# Patient Record
Sex: Male | Born: 1960 | Race: White | Hispanic: No | Smoking: Heavy tobacco smoker
Health system: Southern US, Community
[De-identification: ages and names within clinical notes are randomized; demographics above are authoritative.]

## PROBLEM LIST (undated history)

## (undated) DIAGNOSIS — I2510 Atherosclerotic heart disease of native coronary artery without angina pectoris: Secondary | ICD-10-CM

## (undated) DIAGNOSIS — M549 Dorsalgia, unspecified: Secondary | ICD-10-CM

## (undated) DIAGNOSIS — G629 Polyneuropathy, unspecified: Secondary | ICD-10-CM

## (undated) DIAGNOSIS — F329 Major depressive disorder, single episode, unspecified: Secondary | ICD-10-CM

## (undated) DIAGNOSIS — G8929 Other chronic pain: Secondary | ICD-10-CM

## (undated) DIAGNOSIS — M1990 Unspecified osteoarthritis, unspecified site: Secondary | ICD-10-CM

## (undated) DIAGNOSIS — I639 Cerebral infarction, unspecified: Secondary | ICD-10-CM

## (undated) DIAGNOSIS — M869 Osteomyelitis, unspecified: Secondary | ICD-10-CM

## (undated) DIAGNOSIS — L0390 Cellulitis, unspecified: Secondary | ICD-10-CM

## (undated) DIAGNOSIS — H547 Unspecified visual loss: Secondary | ICD-10-CM

## (undated) DIAGNOSIS — K219 Gastro-esophageal reflux disease without esophagitis: Secondary | ICD-10-CM

## (undated) DIAGNOSIS — F32A Depression, unspecified: Secondary | ICD-10-CM

## (undated) DIAGNOSIS — I209 Angina pectoris, unspecified: Secondary | ICD-10-CM

## (undated) DIAGNOSIS — E785 Hyperlipidemia, unspecified: Secondary | ICD-10-CM

## (undated) DIAGNOSIS — F411 Generalized anxiety disorder: Secondary | ICD-10-CM

## (undated) DIAGNOSIS — I219 Acute myocardial infarction, unspecified: Secondary | ICD-10-CM

## (undated) DIAGNOSIS — E291 Testicular hypofunction: Secondary | ICD-10-CM

## (undated) DIAGNOSIS — N179 Acute kidney failure, unspecified: Secondary | ICD-10-CM

## (undated) DIAGNOSIS — I10 Essential (primary) hypertension: Secondary | ICD-10-CM

## (undated) DIAGNOSIS — F419 Anxiety disorder, unspecified: Secondary | ICD-10-CM

## (undated) DIAGNOSIS — G4733 Obstructive sleep apnea (adult) (pediatric): Secondary | ICD-10-CM

## (undated) HISTORY — PX: NECK SURGERY: SHX720

## (undated) HISTORY — PX: BONY PELVIS SURGERY: SHX572

## (undated) HISTORY — DX: Other chronic pain: G89.29

## (undated) HISTORY — DX: Obstructive sleep apnea (adult) (pediatric): G47.33

## (undated) HISTORY — DX: Generalized anxiety disorder: F41.1

## (undated) HISTORY — PX: FRACTURE SURGERY: SHX138

## (undated) HISTORY — DX: Dorsalgia, unspecified: M54.9

## (undated) HISTORY — PX: CORONARY ANGIOPLASTY: SHX604

## (undated) HISTORY — PX: BLADDER REPAIR: SHX76

## (undated) HISTORY — PX: CARDIAC CATHETERIZATION: SHX172

## (undated) HISTORY — DX: Hyperlipidemia, unspecified: E78.5

## (undated) HISTORY — DX: Polyneuropathy, unspecified: G62.9

## (undated) HISTORY — DX: Testicular hypofunction: E29.1

## (undated) HISTORY — DX: Depression, unspecified: F32.A

## (undated) HISTORY — DX: Atherosclerotic heart disease of native coronary artery without angina pectoris: I25.10

## (undated) HISTORY — DX: Essential (primary) hypertension: I10

## (undated) HISTORY — DX: Major depressive disorder, single episode, unspecified: F32.9

## (undated) HISTORY — PX: CORONARY ANGIOPLASTY WITH STENT PLACEMENT: SHX49

---

## 1999-07-26 ENCOUNTER — Encounter: Payer: Self-pay | Admitting: Orthopedic Surgery

## 1999-07-26 ENCOUNTER — Ambulatory Visit (HOSPITAL_COMMUNITY): Admission: RE | Admit: 1999-07-26 | Discharge: 1999-07-26 | Payer: Self-pay | Admitting: Orthopedic Surgery

## 1999-08-04 ENCOUNTER — Encounter (INDEPENDENT_AMBULATORY_CARE_PROVIDER_SITE_OTHER): Payer: Self-pay

## 1999-08-04 ENCOUNTER — Encounter: Payer: Self-pay | Admitting: Orthopedic Surgery

## 1999-08-05 ENCOUNTER — Encounter: Payer: Self-pay | Admitting: Surgery

## 1999-08-05 ENCOUNTER — Inpatient Hospital Stay (HOSPITAL_COMMUNITY): Admission: RE | Admit: 1999-08-05 | Discharge: 1999-08-06 | Payer: Self-pay | Admitting: Orthopedic Surgery

## 1999-08-17 ENCOUNTER — Ambulatory Visit (HOSPITAL_COMMUNITY): Admission: RE | Admit: 1999-08-17 | Discharge: 1999-08-17 | Payer: Self-pay | Admitting: Urology

## 1999-08-17 ENCOUNTER — Encounter: Payer: Self-pay | Admitting: Urology

## 1999-08-23 ENCOUNTER — Ambulatory Visit (HOSPITAL_COMMUNITY): Admission: RE | Admit: 1999-08-23 | Discharge: 1999-08-23 | Payer: Self-pay | Admitting: Urology

## 1999-08-23 ENCOUNTER — Encounter: Payer: Self-pay | Admitting: Urology

## 1999-08-24 ENCOUNTER — Encounter: Payer: Self-pay | Admitting: Urology

## 1999-08-24 ENCOUNTER — Observation Stay (HOSPITAL_COMMUNITY): Admission: RE | Admit: 1999-08-24 | Discharge: 1999-08-25 | Payer: Self-pay | Admitting: Urology

## 2000-10-19 ENCOUNTER — Encounter: Payer: Self-pay | Admitting: Family Medicine

## 2000-10-19 ENCOUNTER — Encounter: Admission: RE | Admit: 2000-10-19 | Discharge: 2000-10-19 | Payer: Self-pay | Admitting: Family Medicine

## 2002-01-17 ENCOUNTER — Encounter: Payer: Self-pay | Admitting: Family Medicine

## 2002-01-17 ENCOUNTER — Encounter: Admission: RE | Admit: 2002-01-17 | Discharge: 2002-01-17 | Payer: Self-pay | Admitting: Family Medicine

## 2003-05-03 ENCOUNTER — Emergency Department (HOSPITAL_COMMUNITY): Admission: AD | Admit: 2003-05-03 | Discharge: 2003-05-03 | Payer: Self-pay | Admitting: Emergency Medicine

## 2003-05-03 ENCOUNTER — Encounter: Payer: Self-pay | Admitting: Emergency Medicine

## 2003-05-09 ENCOUNTER — Encounter: Payer: Self-pay | Admitting: Orthopedic Surgery

## 2003-05-09 ENCOUNTER — Ambulatory Visit (HOSPITAL_COMMUNITY): Admission: RE | Admit: 2003-05-09 | Discharge: 2003-05-09 | Payer: Self-pay | Admitting: Orthopedic Surgery

## 2003-05-17 ENCOUNTER — Ambulatory Visit (HOSPITAL_COMMUNITY): Admission: RE | Admit: 2003-05-17 | Discharge: 2003-05-17 | Payer: Self-pay | Admitting: Orthopedic Surgery

## 2003-05-29 ENCOUNTER — Ambulatory Visit (HOSPITAL_COMMUNITY): Admission: RE | Admit: 2003-05-29 | Discharge: 2003-05-29 | Payer: Self-pay | Admitting: Orthopedic Surgery

## 2003-05-29 ENCOUNTER — Encounter: Payer: Self-pay | Admitting: Orthopedic Surgery

## 2003-06-26 ENCOUNTER — Emergency Department (HOSPITAL_COMMUNITY): Admission: EM | Admit: 2003-06-26 | Discharge: 2003-06-27 | Payer: Self-pay | Admitting: Emergency Medicine

## 2003-06-30 ENCOUNTER — Ambulatory Visit (HOSPITAL_COMMUNITY): Admission: RE | Admit: 2003-06-30 | Discharge: 2003-07-01 | Payer: Self-pay | Admitting: Neurosurgery

## 2005-09-05 ENCOUNTER — Ambulatory Visit (HOSPITAL_COMMUNITY): Admission: RE | Admit: 2005-09-05 | Discharge: 2005-09-05 | Payer: Self-pay | Admitting: Emergency Medicine

## 2007-12-14 DIAGNOSIS — I2510 Atherosclerotic heart disease of native coronary artery without angina pectoris: Secondary | ICD-10-CM

## 2007-12-14 HISTORY — DX: Atherosclerotic heart disease of native coronary artery without angina pectoris: I25.10

## 2007-12-20 ENCOUNTER — Inpatient Hospital Stay (HOSPITAL_COMMUNITY): Admission: EM | Admit: 2007-12-20 | Discharge: 2007-12-22 | Payer: Self-pay | Admitting: Emergency Medicine

## 2008-01-02 ENCOUNTER — Inpatient Hospital Stay (HOSPITAL_COMMUNITY): Admission: EM | Admit: 2008-01-02 | Discharge: 2008-01-03 | Payer: Self-pay | Admitting: Emergency Medicine

## 2008-04-18 ENCOUNTER — Emergency Department (HOSPITAL_COMMUNITY): Admission: EM | Admit: 2008-04-18 | Discharge: 2008-04-18 | Payer: Self-pay | Admitting: Emergency Medicine

## 2008-04-20 ENCOUNTER — Emergency Department (HOSPITAL_COMMUNITY): Admission: EM | Admit: 2008-04-20 | Discharge: 2008-04-20 | Payer: Self-pay | Admitting: Emergency Medicine

## 2008-04-25 ENCOUNTER — Emergency Department (HOSPITAL_COMMUNITY): Admission: EM | Admit: 2008-04-25 | Discharge: 2008-04-25 | Payer: Self-pay | Admitting: Emergency Medicine

## 2009-01-02 ENCOUNTER — Encounter: Admission: RE | Admit: 2009-01-02 | Discharge: 2009-01-02 | Payer: Self-pay | Admitting: Family Medicine

## 2009-01-13 ENCOUNTER — Emergency Department (HOSPITAL_COMMUNITY): Admission: EM | Admit: 2009-01-13 | Discharge: 2009-01-13 | Payer: Self-pay | Admitting: Emergency Medicine

## 2009-04-09 ENCOUNTER — Observation Stay (HOSPITAL_COMMUNITY): Admission: EM | Admit: 2009-04-09 | Discharge: 2009-04-11 | Payer: Self-pay | Admitting: Emergency Medicine

## 2009-11-27 ENCOUNTER — Encounter: Admission: RE | Admit: 2009-11-27 | Discharge: 2009-11-27 | Payer: Self-pay | Admitting: Emergency Medicine

## 2010-03-15 ENCOUNTER — Encounter: Admission: RE | Admit: 2010-03-15 | Discharge: 2010-03-15 | Payer: Self-pay | Admitting: Family Medicine

## 2010-11-20 LAB — POCT CARDIAC MARKERS
CKMB, poc: 3.1 ng/mL (ref 1.0–8.0)
Myoglobin, poc: 89.9 ng/mL (ref 12–200)
Troponin i, poc: <0.05 ng/mL (ref 0.00–0.09)

## 2010-11-20 LAB — COMPREHENSIVE METABOLIC PANEL
ALT: 80 U/L — ABNORMAL HIGH (ref 0–53)
AST: 50 U/L — ABNORMAL HIGH (ref 0–37)
Albumin: 5.1 g/dL (ref 3.5–5.2)
Alkaline Phosphatase: 55 U/L (ref 39–117)
BUN: 17 mg/dL (ref 6–23)
CO2: 24 mEq/L (ref 19–32)
Calcium: 9.9 mg/dL (ref 8.4–10.5)
Chloride: 101 mEq/L (ref 96–112)
Creatinine, Ser: 0.86 mg/dL (ref 0.4–1.5)
GFR calc Af Amer: >60 mL/min (ref >60)
GFR calc non Af Amer: >60 mL/min (ref >60)
Glucose, Bld: 100 mg/dL — ABNORMAL HIGH (ref 70–99)
Potassium: 4 mEq/L (ref 3.5–5.1)
Sodium: 136 mEq/L (ref 135–145)
Total Bilirubin: 0.8 mg/dL (ref 0.3–1.2)
Total Protein: 7.8 g/dL (ref 6.0–8.3)

## 2010-11-20 LAB — URINALYSIS, ROUTINE W REFLEX MICROSCOPIC
Bilirubin Urine: NEGATIVE
Glucose, UA: NEGATIVE mg/dL
Hgb urine dipstick: NEGATIVE
Specific Gravity, Urine: 1.023 (ref 1.005–1.030)
pH: 7 (ref 5.0–8.0)

## 2010-11-20 LAB — BASIC METABOLIC PANEL
BUN: 16 mg/dL (ref 6–23)
BUN: 16 mg/dL (ref 6–23)
BUN: 19 mg/dL (ref 6–23)
CO2: 25 mEq/L (ref 19–32)
Calcium: 10 mg/dL (ref 8.4–10.5)
Calcium: 8.7 mg/dL (ref 8.4–10.5)
Chloride: 100 mEq/L (ref 96–112)
Chloride: 103 mEq/L (ref 96–112)
Creatinine, Ser: 0.84 mg/dL (ref 0.4–1.5)
Creatinine, Ser: 0.88 mg/dL (ref 0.4–1.5)
Creatinine, Ser: 1.04 mg/dL (ref 0.4–1.5)
GFR calc Af Amer: >60 mL/min (ref >60)
GFR calc Af Amer: >60 mL/min (ref >60)
GFR calc Af Amer: >60 mL/min (ref >60)
GFR calc non Af Amer: >60 mL/min (ref >60)
GFR calc non Af Amer: >60 mL/min (ref >60)
Glucose, Bld: 108 mg/dL — ABNORMAL HIGH (ref 70–99)
Potassium: 4.5 mEq/L (ref 3.5–5.1)
Potassium: 4.7 mEq/L (ref 3.5–5.1)
Sodium: 138 mEq/L (ref 135–145)

## 2010-11-20 LAB — PROTIME-INR
INR: 1 (ref 0.00–1.49)
Prothrombin Time: 12.8 seconds (ref 11.6–15.2)

## 2010-11-20 LAB — CBC
HCT: 43.4 % (ref 39.0–52.0)
HCT: 47.8 % (ref 39.0–52.0)
Hemoglobin: 16.6 g/dL (ref 13.0–17.0)
MCHC: 34.6 g/dL (ref 30.0–36.0)
MCHC: 34.7 g/dL (ref 30.0–36.0)
MCV: 99.6 fL (ref 78.0–100.0)
MCV: 99.7 fL (ref 78.0–100.0)
Platelets: 139 10*3/uL — ABNORMAL LOW (ref 150–400)
Platelets: 142 10*3/uL — ABNORMAL LOW (ref 150–400)
Platelets: 159 10*3/uL (ref 150–400)
RBC: 4.26 MIL/uL (ref 4.22–5.81)
RBC: 4.8 MIL/uL (ref 4.22–5.81)
RDW: 13.4 % (ref 11.5–15.5)
WBC: 5.9 10*3/uL (ref 4.0–10.5)
WBC: 7.5 10*3/uL (ref 4.0–10.5)
WBC: 8.1 10*3/uL (ref 4.0–10.5)

## 2010-11-20 LAB — APTT
aPTT: 29 seconds (ref 24–37)
aPTT: 88 seconds — ABNORMAL HIGH (ref 24–37)

## 2010-11-20 LAB — DIFFERENTIAL
Basophils Absolute: 0 10*3/uL (ref 0.0–0.1)
Basophils Relative: 0 % (ref 0–1)
Eosinophils Absolute: 0.1 10*3/uL (ref 0.0–0.7)
Eosinophils Relative: 1 % (ref 0–5)
Lymphocytes Relative: 23 % (ref 12–46)
Lymphs Abs: 1.9 10*3/uL (ref 0.7–4.0)
Monocytes Absolute: 0.8 10*3/uL (ref 0.1–1.0)
Monocytes Relative: 9 % (ref 3–12)
Neutro Abs: 5.4 10*3/uL (ref 1.7–7.7)
Neutrophils Relative %: 66 % (ref 43–77)

## 2010-11-20 LAB — CK TOTAL AND CKMB (NOT AT ARMC)
CK, MB: 3.7 ng/mL (ref 0.3–4.0)
Relative Index: 2.2 (ref 0.0–2.5)
Total CK: 167 U/L (ref 7–232)

## 2010-11-20 LAB — CARDIAC PANEL(CRET KIN+CKTOT+MB+TROPI)
CK, MB: 2.6 ng/mL (ref 0.3–4.0)
Relative Index: 2.2 (ref 0.0–2.5)
Total CK: 144 U/L (ref 7–232)
Troponin I: 0.01 ng/mL (ref 0.00–0.06)
Troponin I: 0.02 ng/mL (ref 0.00–0.06)

## 2010-11-20 LAB — HEMOGLOBIN A1C
Hgb A1c MFr Bld: 5.3 % (ref 4.6–6.1)
Mean Plasma Glucose: 105 mg/dL

## 2010-11-20 LAB — TROPONIN I: Troponin I: 0.02 ng/mL (ref 0.00–0.06)

## 2010-11-20 LAB — HEPARIN LEVEL (UNFRACTIONATED)
Heparin Unfractionated: 0.43 IU/mL (ref 0.30–0.70)
Heparin Unfractionated: 0.43 IU/mL (ref 0.30–0.70)

## 2010-11-20 LAB — MAGNESIUM: Magnesium: 2.2 mg/dL (ref 1.5–2.5)

## 2010-12-28 NOTE — Discharge Summary (Signed)
NAME:  Leonard Murray, PENN NO.:  [id number]   MEDICAL RECORD NO.:  [id number]          PATIENT TYPE:  INP   LOCATION:  2036                         FACILITY:  [facility]   PHYSICIAN:  Cristy Hilts. Jacinto Halim, MD       DATE OF BIRTH:  1961-02-09   DATE OF ADMISSION:  01/02/2008  DATE OF DISCHARGE:  01/03/2008                               DISCHARGE SUMMARY   DISCHARGE DIAGNOSES:  1. Chest pain.  The patient ruled out for myocardial infraction,      status post coronary artery bypass graft with admission -  patent      stent, a chest pain likely secondary to coronary vasospasm.  2. Known coronary artery disease status post to recent stenting of the      right coronary artery at the beginning of May 2009.  3. Known hypertension.  4. Known hyperlipidemia-treated.  5. Tobacco abuse-on Chantix.   This is a 50 year old patient of Dr. Tresa Endo, who recently was in the  hospital with onset of chest pain and underwent cardiac catheterization  and stenting of the RCA on Dec 20, 2007.  He was doing well up until  today, when he developed significant chest pain, he went to his primary  care physician and was sent down to [facility] with EKG changes  suggestive of ST elevation and myocardial infraction.  The patient was  taken to the cath lab by Dr. Jacinto Halim on Jan 03, 2008, underwent coronary  angiography that revealed a patent stent and no progression of the  disease, but the cath revealed documented coronary vasospasm with EKG  changes.  The next morning the patient was assessed by Dr. Jacinto Halim and  felt to be stable for discharge home.   LABORATORY DATA:  His cardiac enzymes is negative x2.  His hemoglobin  was 13.4, hematocrit 38.3, white blood cell count 7.2, and platelets  190.  Sodium 138, potassium 3.8, chloride 105, CO2 25, BUN 9, creatinine  0.82, and glucose 116.   DISCHARGE MEDICATIONS:  1. Plavix 75 mg daily.  2. Toprol-XL 75 mg daily.  3. Aspirin 325 mg daily.  4. Lisinopril  10 mg daily.  5. Zantac 150 mg daily.  6. Zocor 40 mg daily.  7. Imdur 60 mg daily.   DISCHARGE INSTRUCTIONS:  Avoid driving for 3 days as well as lifting  greater than 5 pounds for 3 days and increase activity slowly.  Maintain  a low-salt and low-cholesterol diet.  Followup appointment will be with  Dr. Tresa Endo as scheduled before the admission to the hospital.     ______________________________  Oletta Darter. Azucena Kuba, M.D.      Cristy Hilts. Jacinto Halim, MD  Electronically Signed    MGR/MEDQ  D:  01/03/2008  T:  01/04/2008  Job:  [id number]   cc:   Nicki Guadalajara, M.D.

## 2010-12-28 NOTE — Cardiovascular Report (Signed)
NAME:  BEN, HABERMANN NO.:  [id number]   MEDICAL RECORD NO.:  [id number]          PATIENT TYPE:  INP   LOCATION:  2013                         FACILITY:  [facility]   PHYSICIAN:  Nicki Guadalajara, M.D.     DATE OF BIRTH:  Jul 30, 1961   DATE OF PROCEDURE:  04/10/2009  DATE OF DISCHARGE:                            CARDIAC CATHETERIZATION   INDICATIONS:  Leonard Murray is a 50 year old gentleman who has  established coronary artery disease and previously has undergone  stenting of his proximal to mid right coronary artery with insertion of  a 3.5- x 18-mm drug-eluting PROMUS stent and stenting of his distal RCA  with a 2.25- x 12-mm Taxus Adam stent.  At the time of his  catheterization in May 2009, he also had 40% LAD lesions as well as 30-  40% circumflex stenoses.  He has continued to smoke cigarettes  excessively and has a history of EtOH use.  He was admitted to [facility] yesterday after recurrent episodes of chest discomfort,  for which he had taken 6 nitroglycerins.  He was admitted with possible  unstable angina.  Cardiac enzymes have been negative.  Definitive repeat  catheterization was recommended.   PROCEDURE:  After premedication with Valium, now with Versed 2 mg  intravenously plus fentanyl 25 mcg, the patient was prepped and draped  in usual fashion.  His right femoral artery was punctured anteriorly and  a 5-French sheath was inserted without difficulty.  Diagnostic cardiac  catheterization was done utilizing 5-French Judkins 4 left and right  coronary catheters.  A 200 mcg intracoronary nitroglycerin was also  administered selectively into the right coronary artery.  A pigtail  catheter was used for biplane cine left ventriculography.  Distal  aortography was not done since this had been done previously.  Hemostasis was obtained by direct manual pressure.   HEMODYNAMIC DATA:  Central aortic pressure was 100/61.  Left ventricular  pressure 100/7,  post A-wave 14.   ANGIOGRAPHIC DATA:  Left main coronary was angiographically normal and  trifurcated into an LAD small ramus intermediate vessel in the left  circumflex coronary artery   The LAD gave rise to a proximal bifurcating diagonal vessel.  The LAD  had smooth 20% narrowing after this first diagonal vessel.  The LAD gave  rise to 2 additional diagonal vessels and extended to wrapped around the  apex.   There was a small ramus intermediate vessel, which was angiographically  normal and very small caliber.  The circumflex vessel was  angiographically normal.   The right coronary artery was a large-caliber vessel that has mild 10%  luminal irregularity.  The previously placed PROMUS 3.5 mm stent in the  proximal to mid RCA was widely patent without in-stent restenosis.  There was mild 20% narrowing in the distal RCA beyond the crux prior to  the PDA takeoff.  The Taxus Adam 2.25 stent in the distal RCA just  proximal to the PLA was widely patent.  Just beyond this Taxus Adam  stent on the initial injection, it appeared to be a  70-80% narrowing in  a small vessel, but the PLA did supply the entire posterolateral wall,  but was small in caliber.  In several additional views, this did not  appear as tight particularly an AP cranial view and one with slight RAO  angulation cranially.  IC nitroglycerin was also administered down the  right coronary artery and there was significant improvement in this area  of initial narrowing such that it was in the 30 to less than 40% range  suggesting a component of coronary vasospasm.   Biplane cini left ventriculography revealed preserved global  contractility without significant focal wall motion abnormality.   IMPRESSION:  1. Myocardial obstructive disease with 20% smooth narrowing in the      left anterior descending beyond the first diagonal vessel, normal      intermediate and circumflex vessel.  2. Right coronary artery with smooth  10% proximal narrowing with      widely patent 3.5- x 18-mm PROMUS stent in the proximal to mid      right coronary artery, 20% luminal narrowing at a smooth and a      distal right coronary artery before the posterior descending artery      takeoff with a widely patent Taxus Adam 2.25- x 12-mm stent in the      distal right coronary artery just proximal to the posterolateral      coronary artery with initial 70 to less than 80% narrowing in the      posterolateral coronary artery beyond the stented segment, however,      this did significantly improved following IC nitroglycerin      administration, suggesting a component of coronary vasospasm with      the narrowing improving to less than 40%.   RECOMMENDATIONS:  Medical therapy and smoking cessation.           ______________________________  Nicki Guadalajara, M.D.     TK/MEDQ  D:  04/10/2009  T:  04/11/2009  Job:  [id number]   cc:   Brett Canales A. Cleta Alberts, M.D.  Nicki Guadalajara, M.D.

## 2010-12-28 NOTE — Cardiovascular Report (Signed)
NAME:  Leonard Murray, Leonard Murray NO.:  [id number]   MEDICAL RECORD NO.:  [id number]          PATIENT TYPE:  INP   LOCATION:  2036                         FACILITY:  [facility]   PHYSICIAN:  Cristy Hilts. Jacinto Halim, MD       DATE OF BIRTH:  03-15-1961   DATE OF PROCEDURE:  01/02/2008  DATE OF DISCHARGE:  01/03/2008                            CARDIAC CATHETERIZATION   PROCEDURES PERFORMED:  1. Left ventriculography.  2. Selective right and left coronary arteriography.  3. Ascending aortogram.  4. Right femoral arteriography and closure of the right femoral      arterial access with StarClose.   INDICATIONS:  [name] is a 47-year gentleman who has recently  underwent cardiac catheterization on Dec 20, 2007, and underwent  implantation of a 3.5 x 80 mm Promus stent in his mid RCA.  He has mild  noncritical coronary artery disease of circumflex and LAD.  He was  readmitted to the hospital with chest pain and his myocardial infarction  was ruled out.  Given the recent stent implantation, he was now brought  to the cardiac catheterization lab to evaluate his coronary anatomy and  to also look for mechanical complications from prior angioplasty.   Ascending aortogram was performed to evaluate for ascending aortic  aneurysm and aortic dissection.   HEMODYNAMIC DATA:  The left ventricular pressure was 174/40 with an end-  diastolic pressure of 18 mmHg.  Aortic pressure was 184/82 with a mean  of 88 mmHg.  There was no pressure gradient across the aortic valve.   ANGIOGRAPHIC DATA:  Left ventricle:  Left ventricular systolic function  was normal with ejection fraction of 50-55% with mild global  hypokinesis.  There was no significant mitral regurgitation.   Right coronary artery:  Right coronary artery is a large-caliber vessel  and a dominant vessel.  There is mild noncritical coronary artery  disease.  The previously placed Promus stent in the mid RCA, that is 3.5  x 18 mm stent on  Dec 20, 2007, is widely patent.  There is also a 2.25 x  12 mm Taxus stent placed on Dec 20, 2007, into the distal PLA branch  placed on the same day, is also widely patent.    Left main coronary artery:  Left main coronary artery is large-caliber  vessel.  It is smooth and normal.   LAD:  LAD is a large-caliber vessel.  The diagonal 1 is small-to-  moderate sized and has an ostial 70% stenosis, which is unchanged.  Mid  LAD has a 40% stenosis followed by mild luminal irregularity.  This is  unchanged from prior cardiac catheterization.   Ramus intermediate:  Ramus intermedius is a small-caliber vessel.  It is  smooth and normal.   Circumflex coronary artery:  Circumflex coronary artery is a moderate-  caliber vessel with a 30-40% mid stenosis.   Ascending aortogram.  Ascending aortogram revealed presence of 3 aortic  valve cusps.  There is no evidence of aortic regurgitation.  No evidence  of aortic dissection.   IMPRESSION:  Patent stents to  the right with noncritical coronary artery  disease in the left anterior descending and circumflex.  The diagonal 1  has a 70% ostial stenosis, unchanged from prior cardiac catheterization.   RECOMMENDATIONS:  Evaluation for noncardiac cause of chest pain is  indicated.  Also, cannot exclude coronary spasm given the fact that he  does have mild diffuse disease in his coronaries.  Continued secondary  prevention aggressively is indicated.   TECHNIQUE OF PROCEDURE:  Under usual sterile precautions, using a 6-  French right femoral arterial access, 6-French multipurpose B2 catheter  was advanced into the ascending aorta and advanced to the left  ventricle.  Left ventriculography was performed both in LAO and RAO  projection.  The catheter pulled into the ascending aorta.  Right  coronary selectively engaged and angiography was performed, and catheter  was then pulled out of the body.  A 6-French Judkins left 4 diagnostic  catheter was utilized  to engage the left main coronary artery and  angiography was performed.  The catheter was then pulled out of body in  the usual fashion, and right femoral arteriography was performed through  the arterial access sheath and the access was closed with StarClose with  excellent hemostasis.  The patient tolerated the procedure well.  No  immediate complications were noted.       Cristy Hilts. Jacinto Halim, MD  Electronically Signed     JRG/MEDQ  D:  01/26/2008  T:  01/26/2008  Job:  [id number]   cc:   Nicki Guadalajara, M.D.

## 2010-12-28 NOTE — Cardiovascular Report (Signed)
NAME:  Leonard Murray, WHILDEN NO.:  [id number]   MEDICAL RECORD NO.:  [id number]          PATIENT TYPE:  INP   LOCATION:  2807                         FACILITY:  [facility]   PHYSICIAN:  Nicki Guadalajara, M.D.     DATE OF BIRTH:  24-Jul-1961   DATE OF PROCEDURE:  12/20/2007  DATE OF DISCHARGE:                            CARDIAC CATHETERIZATION   HISTORY OF PRESENT ILLNESS:  [name] is a 50 year old gentleman  who has a history of hypertension, hyperlipidemia, longstanding tobacco  use since age 58 currently smoking up to two packs per day.  He has had  several back surgeries.  Apparently, several years ago, he had had an  outpatient cardiac catheterization which apparently showed only mild  CAD.  He presented to [facility] Emergency room today after experiencing  a 2-1/2-hour episode of severe substernal chest tightness/pressure  associated with marked diaphoresis and shortness of breath.  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  His pain ultimately  significant improved with nitroglycerin.  Upon arrival to the emergency  room, his ECG did not show acute ST-T changes, but symptom complex was  worrisome for unstable angina, possible coronary syndrome and cardiac  catheterization was recommended.   PROCEDURE:  After premedication with Versed for a total of 3 mg  intravenous, the patient prepped and draped in usual fashion.  Right  femoral artery was punctured anteriorly and a 5-French sheath was  inserted.  Diagnostic catheterization was done utilizing 5-French  Judkins for left and right coronary catheters.  Numerous additional  views of the right coronary artery were done due to concern that he did  have high-grade PLA lesion and also possibly ulcerated plaque in the mid  RCA.  IC nitroglycerin was administered down the right coronary artery.  The pigtail catheter was used for  biplane cine left ventriculography, as  well as distal aortography.  At this point, I did review the cine  angiograms.  It did appear that he did have a very dominant right  coronary artery up to 90% narrowing in the distal RCA, beginning of the  PLA branch.  In addition, there did appear to be an area of haziness  with possible ulcerated plaque in the mid RCA, corresponding to his  acute coronary event.  The decision was made to perform intervention.  Due to his significant back discomforts from prior surgeries, he did  require a fair amount of additional medication for back comfort  including Fentanyl and additional Versed.  He also was started on IV  nitroglycerin which was titrated ultimately up to 50 mcg.  He received  numerous doses of intracoronary nitroglycerin during the procedure.  The  patient had already been started on Plavix approximately 3 weeks ago by  Dr. Cleta Alberts, and was on Plavix prior to admission which undoubtedly was  fortuitous.  He received an additional 300 mg of Plavix in the lab and  received Angiomax for anticoagulation.  His right sheath was upgraded  to  a 6-French sheath.  After a CT was documented to be therapeutic.  A 6-  [place] guide was inserted and a loose wire was advanced down to the  distal RCA.  Initial cutting balloon arthrotomy in the PLA lesion was  performed with a 2.0 x 10-mm cutting balloon catheter and ultimately now  for several dilatations up to 6 atmospheres.  A 2.25 x 12 mm Taxus Atom  DES stent was then successfully deployed up to 14 atmospheres.  Post  stent dilatation was done with a 2.5 x 10-mm Quantum balloon up to 2.52  mm.  Attention was then directed at the proximal to mid lesion.  The  Quantum balloon was used for pre dilatation of this site.  A 3.5 x 18  mm, Palmaz stent was then successfully deployed with dilatation up to 14  atmospheres x2.  A 3.75 x 15-mm Quantum balloon was used for post stent  dilatation.  Scout angiography  confirmed an excellent angiographic  result with the diffuse 75% stenoses in the proximal to mid segment  being reduced to 0% and the 90% distal lesion being reduced to 0%.    1. Hemodynamic data:  Central aortic pressure is 138/78.  Left      ventricular pressure is 138/12.  2. Angiographic data:  Left main coronary was a long normal vessel      which trifurcated into an LAD intermediate vessel and left      circumflex system.  3. The LAD gave rise to diagonal vessels and several septal      perforating arteries.  There was 40% ostial narrowing in the first      diagonal branch.  4. The intermediate vessel was small caliber and angiographically      normal.  5. The circumflex vessel had mild 20% narrowing prior to the second      marginal vessel.   The right coronary artery was a very large caliber dominant vessel and  there was an area of haziness in the proximal to mid segment with  irregularity with narrowing up to 75%.  Proximal to this 70% lesion,  there was also 40-50% narrowing.  There was 60% narrowing in the acute  margin branch which essentially supplied the territory of the PDA.  There was 90% stenosis in the two inferior LV branches in the proximal  segment of the posterolateral vessel.  Following cutting balloon  arthrotomy with stenting with a 2.25 x 12 mm Taxus Atom stent with post  dilatation up to 2.5 mm, the 90% PLA stenosis was reduced to 0%.  Following PTCA and stenting of the proximal to mid segment area with  ultimate insertion of a 3.5 x 18 mm Palmaz stent postdilated 3.75 mm,  this area was reduced to 0%.   Biplane cine ventriculography done prior to intervention showed  preserved global contractility.  There was a subtle suggestion of a  perhaps minimal area of mid focal inferior hypo contractility on the RAO  projection.  On the LAO projection contractility was normal.   Distal aortography revealed a normal aortoiliac system without evidence  for renal  artery stenosis.   IMPRESSION:  1. Acute coronary syndrome secondary to possible ulcerated plaque in      the proximal to mid segment of the right coronary artery with      narrowing of 75% and also 90% distal RCA stenosis in the proximal      portion of a PLA vessel; 40% ostial  narrowing in the first diagonal      branch of the LAD and 20% narrowing in the circumflex vessel.  2. Normal LV contractility with subtle minimal hypo contractility in      the mid inferior wall.  3. Normal aortoiliac system.  4. Successful coronary intervention with cutting balloon      arthrotomy/stenting of the PLA distal RCA vessel with 90% stenosis      being reduced to 0% ( 2.25 x 12 mm Taxus Atom drug-eluting stent      postdilated 2.51 mm) and a PTCA/stenting of the proximal to mid RCA      at the site of ulcerated lesion with ultimate insertion of a 3.5 x      80-mm Palmaz stent postdilated 3.75 mm.  5. Plavix, Angiomax, IC, and IV nitroglycerin in addition to aspirin      therapy.           ______________________________  Nicki Guadalajara, M.D.     TK/MEDQ  D:  12/20/2007  T:  12/21/2007  Job:  [id number]   cc:   Brett Canales A. Cleta Alberts, M.D.

## 2010-12-31 NOTE — Op Note (Signed)
[facility]  Patient:    Leonard Murray                           MRN: [id number] Proc. Date: 08/04/99 Adm. Date:  [id number] Attending:  Milly Jakob CC:         Harvie Junior, M.D.                           Operative Report  PREOPERATIVE DIAGNOSIS:  Infected hardware, symphysis pubis.  POSTOPERATIVE DIAGNOSIS:  Infected hardware, symphysis pubis.  PRINCIPLE PROCEDURE:  Removal of infected hardware and irrigation and debridement of sinus tract.  SURGEONS: 1. [place] Daphine Deutscher, M.D. 2. Harvie Junior, M.D.  ASSISTANT:  Kerby Less, P.A.  BRIEF HISTORY:  He is a 50 year old male with a long history of having a bad pelvic fracture complicated by a bladder rupture.  He ultimately was treated with symphysis plating and posterior fixation.  Ultimately, he began having a draining infected area in the region of the inguinal area.  This was treated by a general surgeon for several months, and ultimately felt to be communicative to his hardware.  X-rays at that time, showed that his hardware had come loose, and there was free floating hardware.  It was felt that he probably had significant osteomyelitis of the symphysis pubis, and it was tracking from there.  A preoperative bone scan was not able to be read because of the fall out from the  bladder, and because of this, the patient was taken to the operating room for removal of hardware and opening of the sinus tract.  DESCRIPTION OF PROCEDURE:  The exposure of this procedure was made by Dr. Molli Hazard B. Daphine Deutscher of general surgery, who will dictate and bill under separate note. Once the symphyseal area was exposed, the hardware was removed.  The sinus tract was  identified going from the pubic area down to the hardware.  A probe was placed through this area, and this area was curetted free of any granulation tissue. Following this, the bone openings into the bone were curetted, and then  the area was pulsatile lavaged with saline irrigation, and then bacitracin irrigation was filled into this area for cleansing.  Following this, the wound was copiously irrigated and suctioned dry.  Dr. Daphine Deutscher will dictate the closure under a separate OP note, but ultimately, a JP drain was left in place, sewn in place, in the inguinal area.  The patient was taken to the recovery room where he was noted to be in satisfactory condition.  ESTIMATED BLOOD LOSS FOR THE PROCEDURE:  Less than 25 cc. DD:  08/04/99 TD:  08/06/99 Job: [id number]

## 2010-12-31 NOTE — Discharge Summary (Signed)
[place]. [facility]  Patient:    Leonard Murray, Leonard Murray                          MRN: [id number] Adm. Date:  08/04/99 Disc. Date: 08/06/99 Attending:  Harvie Junior, M.D. Dictator:   Kerby Less, P.A. CC:         Harvie Junior, M.D.             Jamison Neighbor, M.D.             [place] Daphine Deutscher, M.D.                           Discharge Summary  CHIEF COMPLAINT:  Pelvic discomfort.  HISTORY OF PRESENT ILLNESS:  This is a 50 year old white male status post trauma to his pelvis with an ORIF, now with osteomyelitis suspected and loose hardware, admitted to remove the loosened hardware.  Please refer to admission H&P for further details.  HOSPITAL COURSE:  The patient came into the hospital with an MRSA infection of he pubis and right groin area, tracking out to the skin in that area with drainage  that had been followed by Dr. Wenda Low, and is admitted for removal of hardware. He was taken to the OR and Dr. Daphine Deutscher placed a subclavian Hickman line, after which we opened the pelvic area and dissected to the hardware and removed the old hardware.  All of the hardware was recovered.  There was no evidence of significant diseased bone in that area.  There was some infectious drainage that was irrigated and he was placed on IV antibiotics preoperative and postoperatively.  He tolerated the procedure well.  Postoperatively, he had a small leak on the cystogram and e stayed in the hospital a couple of additional days to watch this closely.  He was ultimately sent home on vancomycin IV to be administered by a home health nurse and with a Foley catheter in place.  He will be followed by Dr. Wenda Low as an outpatient for this and Dr. Milly Jakob for his pelvis.  ADMISSION LABORATORY DATA:  White blood count 9.6, hemoglobin 16.8, hematocrit 47.9, platelets 235.  Sed rate 10.  His urinalysis was essentially normal.  C reactive protein was pending.   The rest of his labs were essentially unremarkable except for a slight elevation in ______ of 44.  He showed no growth at two days  with the wound and tissue cultures and ultimately they grew out a few methicillin-resistant Staph aureus, which should have been covered by his antibiotics.  DISPOSITION:  The rest of his hospital course was that of ______.  He was ambulating without difficulty and was discharged in stable condition to home. e was given pain medication by Dr. Luiz Blare and was to empty and record the JP drainage twice a day.  [facility] to follow him for his nursing  needs and he will follow up with Dr. Logan Bores and Dr. Luiz Blare. DD:  08/24/99 TD:  08/24/99 Job: [id number]
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
## 2010-12-31 NOTE — Op Note (Signed)
[place]. [facility]  Patient:    Leonard Murray                           MRN: [id number] Proc. Date: 08/04/99 Adm. Date:  [id number] Attending:  Milly Jakob CC:         Harvie Junior, M.D.                           Operative Report  PREOPERATIVE DIAGNOSIS:  POSTOPERATIVE DIAGNOSIS:  PROCEDURES: 1. Insertion of left subclavian Hickman catheter. 2. Exploration of prepubic region and exposure of pubic ramus hardware.  SURGEON:  [place]. Daphine Deutscher, M.D.  ANESTHESIA:  DESCRIPTION OF PROCEDURE:  The patient was brought to room #14 and after general anesthesia was administered, the left chest was prepped with Betadine and draped sterilely.  On the third cannulation under the left subclavian vein, was able to enter the left subclavian vein and pass the J-wire without difficulty which was  confirmed with the C-arm.  I then took a single lumen Hickman catheter and tunneled it about 2-1/2 inches, imbedding the cuff about an inch.  The skin was closed with 3-0 nylon at that point and tied around the catheter.  This was then cut to fit and then passed through the obturator and peel-away sheath without difficulty.  Its  position was checked with the C-arm and then the wounds were closed with 4-0 Vicryl with Benzoin and Steri-Strips, and it was closed with an Op-Site.  Next, I assisted Harvie Junior, M.D. in exposing this hardware.  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  It appeared to go down to displaced hardware that was attached from its pubic ramus from prior pelvic fracture back in 1998.  I went ahead and excised his old scar and carried this own in the midline, and did a preperitoneal dissection.  I carried this down through some rather dense material to encounter some pus which was all around the hardware. There was some kind of friable looking granulation tissue.  Dr. Luiz Blare proceeded to remove the screws and  the plate, and debride and irrigate this area.  At the time of closing, there was a little bit of blood noted in the Foley catheter.  We just finished pulsatile lavage in this area.  We instilled some methylene blue through the Foley, and did not see any in a drain that we had placed through the sinus tract in the prepubic region.  There was no evidence in our dissection that we entered the bladder in any way.  We did not appear to enter the abdominal cavity at all.  I went ahead and closed this with interrupted #1 Novofil.  The wound was then irrigated again and closed with vertical mattress sutures of  3-0 nylon and staples.  The patient tolerated the procedure well and was taken o the recovery room in satisfactory condition. DD:  08/04/99 TD:  08/06/99 Job: [id number]

## 2010-12-31 NOTE — Op Note (Signed)
NAME:  Leonard Murray, Leonard Murray                           ACCOUNT NO.:  [id number]   MEDICAL RECORD NO.:  [id number]                   PATIENT TYPE:  OIB   LOCATION:  2858                                 FACILITY:  [facility]   PHYSICIAN:  Clydene Fake, M.D.               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   PROCEDURE:  Anterior cervical decompression, diskectomy, and fusion at C6-C7  with BAKC interbody cage and autograft.   SURGEON:  Clydene Fake, M.D.   ASSISTANT:  Coletta Memos, M.D.   ANESTHESIA:  General endotracheal tube anesthesia.   ESTIMATED BLOOD LOSS:  Minimal.   DRAINS:  None.   COMPLICATIONS:  None.   INDICATIONS FOR PROCEDURE:  The patient is a 50 year old gentleman who has  had neck and left arm pain with increasing severity with some weakness in  the left C7 root and decreased sensation.  An MRI was done showing a large  disk herniation and spondylosis at the C6-C7 on the left.  The patient was  brought in for decompression and fusion.   PROCEDURE IN DETAIL:  The patient was brought into the operating room and  general anesthesia induced.  The patient was placed in 10-pound halter  traction, prepped and draped in a sterile fashion.  The site of incision was  injected with 10 cc of 1% lidocaine with epinephrine.  Incision was then  made from the midline to the anterior border of the sternocleidomastoid  muscle in the left side of the neck.  The incision was taken down to the  platysma, and hemostasis obtained with Bovie cauterization. The platysma was  incised with the Bovie, and blunt dissection was taken through the anterior  cervical fascia to the anterior  cervical spine.  A needle was placed in the  interspace, and x-ray was obtained showing the C6-C7 interspace.  This  interspace was incised with a 15-blade as the needle was removed and partial  diskectomy was performed.  The longus colli muscle was reflected laterally  using the Bovie, and the self-retaining retractor system was then placed.  The disk space was then incised with a 15 blade, and diskectomy was  performed with pituitary rongeurs and curettes  Distraction pins were placed  in the C6 and C7 interspace and distracted anterior bone spurs removed with  the Kerrison punches.  A couple free fragments of disk were able to be  removed paracentrally on the left.  Then 1 and 2-mm Kerrison punches were  used to remove the posterior ligament and perform bilateral foraminotomies.  More free fragments of disk were removed from the left foramen decompressing  the C7 root.  When we were done, there was good decompression of the central  canal and bilateral foramen, especially on the left.  We used the high-speed  drill to remove cartilaginous endplates, and then measured the interspace.  It measured 9 mm, so we placed a 9-mm drill guide from the [facility] system for a  12-mm cage.  We reamed the disk space saving all bone, placing it into a 12-  mm BAKC cage.  We reamed down to 15 mm so we could see a thin rim of the  posterior cortex that was left.  We then tapped the interspace, and then we  threaded the cage into the interspace, countersinking it about 1 mm.  We  checked behind the cage.  There was plenty of room between the cage and  dura.  Prior to placement of the cage, distraction was removed, and traction  weight was also removed.  We removed the drill tube.  We then positioned the  cage, removed the distraction pins, and got hemostasis with Gelfoam.  The  Thrombin/Gelfoam was irritated out.  We took another x-ray that showed good  position of the cage at the C6-C7 level.  It was hard to  see the C7  vertebral on the x-ray due to the shoulders.  This area was irrigated with  antibiotic solution.  Again, we had good hemostasis.  This was obtained with  Gelfoam and Thrombin and bipolar cauterization.  Gelfoam again was irrigated  out.  We then closed the skin incision closing the platysma with 3-0 Vicryl  interrupted suture.  The subcutaneous tissue was closed with the same, and  the skin closed with Benzoin and Steri-Strips. Dressing was placed.  The  patient was placed in a cervical collar, reversed from anesthesia, and  transferred to the recovery room in stable condition.                                               Clydene Fake, M.D.    JRH/MEDQ  D:  06/30/2003  T:  06/30/2003  Job:  [id number]

## 2011-05-11 LAB — CBC
HCT: 38.3 — ABNORMAL LOW
Hemoglobin: 13.4
MCHC: 35.1
MCV: 97.1
Platelets: 190
RDW: 13.1

## 2011-05-11 LAB — CARDIAC PANEL(CRET KIN+CKTOT+MB+TROPI)
CK, MB: 1.9
Relative Index: INVALID
Total CK: 60
Total CK: 74
Troponin I: 0.01
Troponin I: 0.04
Troponin I: 0.05

## 2011-05-11 LAB — COMPREHENSIVE METABOLIC PANEL
Alkaline Phosphatase: 49
BUN: 9
Calcium: 8.9
Creatinine, Ser: 0.82
Glucose, Bld: 116 — ABNORMAL HIGH
Potassium: 3.8
Total Protein: 5.7 — ABNORMAL LOW

## 2011-10-06 ENCOUNTER — Other Ambulatory Visit: Payer: Self-pay | Admitting: Physician Assistant

## 2011-10-20 ENCOUNTER — Other Ambulatory Visit: Payer: Self-pay | Admitting: Physician Assistant

## 2011-10-20 MED ORDER — PAROXETINE HCL 20 MG PO TABS: 20.0000 mg | ORAL_TABLET | ORAL | Status: DC

## 2011-11-04 ENCOUNTER — Ambulatory Visit (INDEPENDENT_AMBULATORY_CARE_PROVIDER_SITE_OTHER): Payer: PRIVATE HEALTH INSURANCE

## 2011-11-17 ENCOUNTER — Other Ambulatory Visit: Payer: Self-pay | Admitting: Physician Assistant

## 2011-11-18 ENCOUNTER — Other Ambulatory Visit: Payer: Self-pay | Admitting: Physician Assistant

## 2011-12-01 ENCOUNTER — Other Ambulatory Visit: Payer: Self-pay | Admitting: Emergency Medicine

## 2011-12-01 ENCOUNTER — Ambulatory Visit (INDEPENDENT_AMBULATORY_CARE_PROVIDER_SITE_OTHER): Payer: PRIVATE HEALTH INSURANCE | Admitting: Emergency Medicine

## 2011-12-01 VITALS — BP 106/71 | HR 82 | Temp 98.2°F | Resp 16 | Ht 69.5 in | Wt 227.0 lb

## 2011-12-01 DIAGNOSIS — G629 Polyneuropathy, unspecified: Secondary | ICD-10-CM

## 2011-12-01 DIAGNOSIS — G609 Hereditary and idiopathic neuropathy, unspecified: Secondary | ICD-10-CM

## 2011-12-01 DIAGNOSIS — G894 Chronic pain syndrome: Secondary | ICD-10-CM

## 2011-12-01 DIAGNOSIS — M79603 Pain in arm, unspecified: Secondary | ICD-10-CM

## 2011-12-01 DIAGNOSIS — M79609 Pain in unspecified limb: Secondary | ICD-10-CM

## 2011-12-01 LAB — POCT CBC
Granulocyte percent: 53 %G (ref 37–80)
HCT, POC: 44.2 % (ref 43.5–53.7)
MCH, POC: 33 pg — AB (ref 27–31.2)
MCV: 99.7 fL — AB (ref 80–97)
POC LYMPH PERCENT: 39.8 %L (ref 10–50)
RBC: 4.43 M/uL — AB (ref 4.69–6.13)
WBC: 8.3 10*3/uL (ref 4.6–10.2)

## 2011-12-01 MED ORDER — SIMVASTATIN 40 MG PO TABS: 40.0000 mg | ORAL_TABLET | Freq: Every evening | ORAL | Status: DC

## 2011-12-01 MED ORDER — ZOLPIDEM TARTRATE 10 MG PO TABS: 10.0000 mg | ORAL_TABLET | Freq: Every evening | ORAL | Status: DC | PRN

## 2011-12-01 MED ORDER — NITROGLYCERIN 0.4 MG SL SUBL: 0.4000 mg | SUBLINGUAL_TABLET | SUBLINGUAL | Status: DC | PRN

## 2011-12-01 NOTE — Patient Instructions (Signed)
Please make an appointment to see your cardiologist. I will make an appointment to see the neurologist to have further evaluation of your neuropathy. We will start on folic acid one a day. Start mega b vitamins one a day

## 2011-12-01 NOTE — Progress Notes (Signed)
  Subjective:    Patient ID: Leonard Murray, male    DOB: 21-Jul-1961, 50 y.o.   MRN: [id number]  HPI patient enters with a chief complaint of burning sensation in both feet. He has a history that he has been diagnosed with a peripheral neuropathy. He is not having any true weakness but a burning stinging sensation in both feet. He states he has had a EMG nerve conduction study in the past.    Review of Systems patient has long-standing back problems. He is under [facility]. He has not been to see his cardiologist as has been recommended.     Objective:   Physical Exam physical exam reveals an alert gentleman who is not in any acute distress. His neck is supple his chest is clear his heart is a regular rate without murmurs abdomen soft no tenderness there is a lower midline scar. There are reflex is present in the lower extremities in the knees and ankles. Has decreased position sense in the lower extremities he has decreased vibratory sense in the lower extremities and decreased fine touch.        Assessment & Plan:  Assessment is coronary artery disease. He also has chronic pain syndrome. He is under the care of the [facility]. Patient needs referral to a neurologist for evaluation for neuropathy. I have donet his basic labs for neuropathy. We'll try him on folic acid 1 mg a day along with Mega-B vitamins one a day. Please be sure all labs are sent to Dr. Tresa Endo his heart doctor when they return.

## 2011-12-02 LAB — FOLATE: Folate: 4.3 ng/mL

## 2011-12-02 LAB — LIPID PANEL
Cholesterol: 230 mg/dL — ABNORMAL HIGH (ref 0–200)
LDL Cholesterol: 153 mg/dL — ABNORMAL HIGH (ref 0–99)
Total CHOL/HDL Ratio: 6.8 Ratio
Triglycerides: 216 mg/dL — ABNORMAL HIGH (ref <150)
VLDL: 43 mg/dL — ABNORMAL HIGH (ref 0–40)

## 2011-12-02 LAB — COMPREHENSIVE METABOLIC PANEL
AST: 45 U/L — ABNORMAL HIGH (ref 0–37)
Alkaline Phosphatase: 74 U/L (ref 39–117)
BUN: 12 mg/dL (ref 6–23)
Creat: 0.8 mg/dL (ref 0.50–1.35)
Total Bilirubin: 0.6 mg/dL (ref 0.3–1.2)

## 2011-12-02 LAB — VITAMIN B12: Vitamin B-12: 437 pg/mL (ref 211–911)

## 2011-12-03 ENCOUNTER — Telehealth: Payer: Self-pay

## 2011-12-03 MED ORDER — PAROXETINE HCL 20 MG PO TABS: 20.0000 mg | ORAL_TABLET | ORAL | Status: DC

## 2011-12-03 NOTE — Telephone Encounter (Signed)
Spoke with Chelle and she states he can have 3 refills for #30. Called pharmacy they will work something out for him to receive his other half of RX

## 2011-12-03 NOTE — Telephone Encounter (Signed)
Leonard Murray called concerning his rx for paxil. His rx was for 15 and he needs 30. Patient states that he paid $10 which should have covered the cost of 30 but only received 15. Patient states that he saw Chele.

## 2012-03-07 ENCOUNTER — Other Ambulatory Visit: Payer: Self-pay | Admitting: Physician Assistant

## 2012-06-04 ENCOUNTER — Other Ambulatory Visit: Payer: Self-pay | Admitting: Physician Assistant

## 2012-06-15 ENCOUNTER — Ambulatory Visit (INDEPENDENT_AMBULATORY_CARE_PROVIDER_SITE_OTHER): Payer: PRIVATE HEALTH INSURANCE | Admitting: Emergency Medicine

## 2012-06-15 ENCOUNTER — Inpatient Hospital Stay (HOSPITAL_COMMUNITY)
Admission: EM | Admit: 2012-06-15 | Discharge: 2012-06-17 | DRG: 312 | Disposition: A | Discharge status: Home / Self Care | Payer: PRIVATE HEALTH INSURANCE | Attending: Family Medicine | Admitting: Family Medicine

## 2012-06-15 ENCOUNTER — Emergency Department (HOSPITAL_COMMUNITY): Payer: PRIVATE HEALTH INSURANCE

## 2012-06-15 VITALS — BP 69/45 | HR 74 | Temp 97.9°F | Resp 20

## 2012-06-15 DIAGNOSIS — R55 Syncope and collapse: Secondary | ICD-10-CM

## 2012-06-15 DIAGNOSIS — M549 Dorsalgia, unspecified: Secondary | ICD-10-CM | POA: Diagnosis present

## 2012-06-15 DIAGNOSIS — F1020 Alcohol dependence, uncomplicated: Secondary | ICD-10-CM | POA: Diagnosis present

## 2012-06-15 DIAGNOSIS — F17200 Nicotine dependence, unspecified, uncomplicated: Secondary | ICD-10-CM | POA: Diagnosis present

## 2012-06-15 DIAGNOSIS — G629 Polyneuropathy, unspecified: Secondary | ICD-10-CM | POA: Insufficient documentation

## 2012-06-15 DIAGNOSIS — E291 Testicular hypofunction: Secondary | ICD-10-CM | POA: Diagnosis present

## 2012-06-15 DIAGNOSIS — F411 Generalized anxiety disorder: Secondary | ICD-10-CM

## 2012-06-15 DIAGNOSIS — F10939 Alcohol use, unspecified with withdrawal, unspecified: Secondary | ICD-10-CM

## 2012-06-15 DIAGNOSIS — I2510 Atherosclerotic heart disease of native coronary artery without angina pectoris: Secondary | ICD-10-CM | POA: Insufficient documentation

## 2012-06-15 DIAGNOSIS — G4733 Obstructive sleep apnea (adult) (pediatric): Secondary | ICD-10-CM | POA: Diagnosis present

## 2012-06-15 DIAGNOSIS — Z79899 Other long term (current) drug therapy: Secondary | ICD-10-CM

## 2012-06-15 DIAGNOSIS — F32A Depression, unspecified: Secondary | ICD-10-CM | POA: Insufficient documentation

## 2012-06-15 DIAGNOSIS — G609 Hereditary and idiopathic neuropathy, unspecified: Secondary | ICD-10-CM | POA: Diagnosis present

## 2012-06-15 DIAGNOSIS — Z7982 Long term (current) use of aspirin: Secondary | ICD-10-CM

## 2012-06-15 DIAGNOSIS — F329 Major depressive disorder, single episode, unspecified: Secondary | ICD-10-CM | POA: Insufficient documentation

## 2012-06-15 DIAGNOSIS — I9589 Other hypotension: Secondary | ICD-10-CM

## 2012-06-15 DIAGNOSIS — F19939 Other psychoactive substance use, unspecified with withdrawal, unspecified: Secondary | ICD-10-CM | POA: Diagnosis present

## 2012-06-15 DIAGNOSIS — G8929 Other chronic pain: Secondary | ICD-10-CM | POA: Insufficient documentation

## 2012-06-15 DIAGNOSIS — I252 Old myocardial infarction: Secondary | ICD-10-CM

## 2012-06-15 DIAGNOSIS — R5381 Other malaise: Secondary | ICD-10-CM

## 2012-06-15 DIAGNOSIS — F10239 Alcohol dependence with withdrawal, unspecified: Secondary | ICD-10-CM

## 2012-06-15 DIAGNOSIS — F3289 Other specified depressive episodes: Secondary | ICD-10-CM | POA: Diagnosis present

## 2012-06-15 DIAGNOSIS — I951 Orthostatic hypotension: Principal | ICD-10-CM | POA: Diagnosis present

## 2012-06-15 DIAGNOSIS — I10 Essential (primary) hypertension: Secondary | ICD-10-CM | POA: Diagnosis present

## 2012-06-15 DIAGNOSIS — R5383 Other fatigue: Secondary | ICD-10-CM

## 2012-06-15 DIAGNOSIS — F1320 Sedative, hypnotic or anxiolytic dependence, uncomplicated: Secondary | ICD-10-CM | POA: Diagnosis present

## 2012-06-15 DIAGNOSIS — I959 Hypotension, unspecified: Secondary | ICD-10-CM

## 2012-06-15 DIAGNOSIS — F1011 Alcohol abuse, in remission: Secondary | ICD-10-CM | POA: Diagnosis present

## 2012-06-15 DIAGNOSIS — N179 Acute kidney failure, unspecified: Secondary | ICD-10-CM | POA: Diagnosis present

## 2012-06-15 DIAGNOSIS — R7309 Other abnormal glucose: Secondary | ICD-10-CM | POA: Diagnosis present

## 2012-06-15 LAB — COMPREHENSIVE METABOLIC PANEL
ALT: 56 U/L — ABNORMAL HIGH (ref 0–53)
AST: 39 U/L — ABNORMAL HIGH (ref 0–37)
CO2: 21 mEq/L (ref 19–32)
Calcium: 9.1 mg/dL (ref 8.4–10.5)
Chloride: 99 mEq/L (ref 96–112)
Creatinine, Ser: 2.22 mg/dL — ABNORMAL HIGH (ref 0.50–1.35)
GFR calc Af Amer: 37 mL/min — ABNORMAL LOW (ref >90)
GFR calc non Af Amer: 32 mL/min — ABNORMAL LOW (ref >90)
Glucose, Bld: 79 mg/dL (ref 70–99)
Total Bilirubin: 0.5 mg/dL (ref 0.3–1.2)

## 2012-06-15 LAB — CBC WITH DIFFERENTIAL/PLATELET
Basophils Absolute: 0 10*3/uL (ref 0.0–0.1)
Eosinophils Relative: 1 % (ref 0–5)
HCT: 42.2 % (ref 39.0–52.0)
Hemoglobin: 14.9 g/dL (ref 13.0–17.0)
Lymphocytes Relative: 20 % (ref 12–46)
Lymphs Abs: 2.4 10*3/uL (ref 0.7–4.0)
MCV: 96.1 fL (ref 78.0–100.0)
Monocytes Absolute: 1.3 10*3/uL — ABNORMAL HIGH (ref 0.1–1.0)
Neutro Abs: 7.9 10*3/uL — ABNORMAL HIGH (ref 1.7–7.7)
RBC: 4.39 MIL/uL (ref 4.22–5.81)
RDW: 12.7 % (ref 11.5–15.5)
WBC: 11.6 10*3/uL — ABNORMAL HIGH (ref 4.0–10.5)

## 2012-06-15 LAB — POCT I-STAT, CHEM 8
BUN: 32 mg/dL — ABNORMAL HIGH (ref 6–23)
Chloride: 101 mEq/L (ref 96–112)
HCT: 44 % (ref 39.0–52.0)
Potassium: 4.6 mEq/L (ref 3.5–5.1)
Sodium: 136 mEq/L (ref 135–145)

## 2012-06-15 MED ORDER — LORAZEPAM 2 MG/ML IJ SOLN
1.0000 mg | INTRAMUSCULAR | Status: DC
  Administered 2012-06-17: 1 mg via INTRAVENOUS
  Filled 2012-06-15: qty 1

## 2012-06-15 MED ORDER — PREGABALIN 25 MG PO CAPS
75.0000 mg | ORAL_CAPSULE | Freq: Three times a day (TID) | ORAL | Status: DC
  Administered 2012-06-16 – 2012-06-17 (×4): 75 mg via ORAL
  Filled 2012-06-15 (×4): qty 3

## 2012-06-15 MED ORDER — FOLIC ACID 1 MG PO TABS
1.0000 mg | ORAL_TABLET | Freq: Every day | ORAL | Status: DC
  Administered 2012-06-16 – 2012-06-17 (×2): 1 mg via ORAL
  Filled 2012-06-15 (×2): qty 1

## 2012-06-15 MED ORDER — CLOPIDOGREL BISULFATE 75 MG PO TABS
75.0000 mg | ORAL_TABLET | Freq: Every day | ORAL | Status: DC
  Administered 2012-06-16 – 2012-06-17 (×2): 75 mg via ORAL
  Filled 2012-06-15 (×2): qty 1

## 2012-06-15 MED ORDER — LORAZEPAM 2 MG/ML IJ SOLN: 1.0000 mg | INTRAMUSCULAR | Status: DC | PRN

## 2012-06-15 MED ORDER — LORAZEPAM 1 MG PO TABS
1.0000 mg | ORAL_TABLET | ORAL | Status: DC
  Administered 2012-06-16 – 2012-06-17 (×7): 1 mg via ORAL
  Filled 2012-06-15 (×9): qty 1

## 2012-06-15 MED ORDER — ADULT MULTIVITAMIN W/MINERALS CH
1.0000 | ORAL_TABLET | Freq: Every day | ORAL | Status: DC
  Administered 2012-06-16 – 2012-06-17 (×2): 1 via ORAL
  Filled 2012-06-15 (×2): qty 1

## 2012-06-15 MED ORDER — THIAMINE HCL 100 MG/ML IJ SOLN
Freq: Once | INTRAVENOUS | Status: AC
  Administered 2012-06-16: 02:00:00 via INTRAVENOUS
  Filled 2012-06-15: qty 1000

## 2012-06-15 MED ORDER — SODIUM CHLORIDE 0.9 % IV BOLUS (SEPSIS)
1000.0000 mL | Freq: Once | INTRAVENOUS | Status: AC
  Administered 2012-06-15: 1000 mL via INTRAVENOUS

## 2012-06-15 MED ORDER — SODIUM CHLORIDE 0.9 % IV BOLUS (SEPSIS)
1000.0000 mL | Freq: Once | INTRAVENOUS | Status: AC
Start: 2012-06-15 — End: 2012-06-15
  Administered 2012-06-15: 1000 mL via INTRAVENOUS

## 2012-06-15 MED ORDER — HYDROCODONE-ACETAMINOPHEN 5-325 MG PO TABS
1.0000 | ORAL_TABLET | ORAL | Status: DC | PRN
  Administered 2012-06-16: 2 via ORAL
  Filled 2012-06-15: qty 2

## 2012-06-15 MED ORDER — LORAZEPAM 1 MG PO TABS: 1.0000 mg | ORAL_TABLET | ORAL | Status: DC | PRN

## 2012-06-15 MED ORDER — THIAMINE HCL 100 MG/ML IJ SOLN
100.0000 mg | Freq: Every day | INTRAMUSCULAR | Status: DC
  Filled 2012-06-15 (×2): qty 1

## 2012-06-15 MED ORDER — LORAZEPAM 2 MG/ML IJ SOLN
1.0000 mg | Freq: Once | INTRAMUSCULAR | Status: AC
  Administered 2012-06-15: 1 mg via INTRAVENOUS
  Filled 2012-06-15: qty 1

## 2012-06-15 MED ORDER — HEPARIN SODIUM (PORCINE) 5000 UNIT/ML IJ SOLN
5000.0000 [IU] | Freq: Three times a day (TID) | INTRAMUSCULAR | Status: DC
  Administered 2012-06-16 – 2012-06-17 (×6): 5000 [IU] via SUBCUTANEOUS
  Filled 2012-06-15 (×8): qty 1

## 2012-06-15 MED ORDER — VITAMIN B-1 100 MG PO TABS
100.0000 mg | ORAL_TABLET | Freq: Every day | ORAL | Status: DC
  Administered 2012-06-16 – 2012-06-17 (×2): 100 mg via ORAL
  Filled 2012-06-15 (×2): qty 1

## 2012-06-15 MED ORDER — ASPIRIN EC 81 MG PO TBEC
81.0000 mg | DELAYED_RELEASE_TABLET | Freq: Every day | ORAL | Status: DC
  Administered 2012-06-16 – 2012-06-17 (×2): 81 mg via ORAL
  Filled 2012-06-15 (×3): qty 1

## 2012-06-15 NOTE — ED Provider Notes (Signed)
History     CSN: [id number]  Arrival date & time 06/15/12  1642   First MD Initiated Contact with Patient 06/15/12 1646      No chief complaint on file.   (Consider location/radiation/quality/duration/timing/severity/associated sxs/prior treatment) HPI  51 year old male with history of chronic back pain, history of hypertension, and history of coronary artery disease presents for evaluation of low blood pressure and syncope.  Pt reports for the past several months pt has lightheadedness whenever he stands up.  He also has several episodes of syncope from lightheadedness.  Pt had a syncopal episode today when standing up at the truck stop.  Had brief syncopal episode witnessed by son.  Endorse mild temporal headache afterward.  Onset gradual, intermittent, moderate in severity, recurrent, worsening with positional changes.  Denies c/p, sob, abd pain, n/v/d, hematochezia, or melena.  Does takes narcotic pain medication on a daily basis but denies any significant medication changes.  Denies alcohol or rec drug use.  Sts he hasn't been eating and drinking as much as he should.  Currently endorse headache, and usual neck/back/and feet pain that he always has.  Pt was sent from [facility] for further evaluation of his syncope.  Initially BP in the 80s systolic.  Does have significant cardiac hx with several MIs.  No CP or SOB at this time.       Past Medical History  Diagnosis Date  . CAD (coronary artery disease) 12/2007    AMI with stenting; retinal embolus 08/2005; stenting 03/2009  . Hyperlipidemia   . Hypertension   . OSA (obstructive sleep apnea)     not using CPAP-feels suffocating  . GAD (generalized anxiety disorder)   . Depression   . Chronic back pain     injuries 1998, 2008, 01/2009  . Peripheral neuropathy   . Hypogonadism male     can't afford testosterone    No past surgical history on file.  No family history on file.  History  Substance Use Topics  . Smoking status:  Current Every Day Smoker -- 1.0 packs/day    Types: Cigarettes  . Smokeless tobacco: Not on file  . Alcohol Use: Yes      Review of Systems  All other systems reviewed and are negative.    Allergies  Review of patient's allergies indicates no known allergies.  Home Medications   Current Outpatient Rx  Name Route Sig Dispense Refill  . ALPRAZOLAM 1 MG PO TABS Oral Take 1 mg by mouth 3 (three) times daily as needed.    Marland Kitchen AMITRIPTYLINE HCL 75 MG PO TABS  TAKE 1 TABLET DAILY AT BEDTIME. 30 tablet 2  . AMLODIPINE BESYLATE 10 MG PO TABS Oral Take 10 mg by mouth daily.    . ASPIRIN 81 MG PO TABS Oral Take 81 mg by mouth daily.    Marland Kitchen CLOPIDOGREL BISULFATE 75 MG PO TABS Oral Take 75 mg by mouth daily.    . ISOSORBIDE MONONITRATE ER 60 MG PO TB24 Oral Take 60 mg by mouth daily.    Marland Kitchen LISINOPRIL 20 MG PO TABS Oral Take 20 mg by mouth daily.    . NEBIVOLOL HCL 10 MG PO TABS Oral Take 10 mg by mouth daily.    Marland Kitchen NIACIN ER (ANTIHYPERLIPIDEMIC) 500 MG PO TBCR Oral Take 500 mg by mouth at bedtime.    Marland Kitchen NITROGLYCERIN 0.4 MG SL SUBL Sublingual Place 1 tablet (0.4 mg total) under the tongue every 5 (five) minutes as needed. 30 tablet 1  .  OXYCODONE HCL ER 10 MG PO TB12 Oral Take 10 mg by mouth every 12 (twelve) hours. 2 tablet in AM and 1 tablet in PM    . OXYCODONE HCL 10 MG PO TABS Oral Take 10 mg by mouth every 4 (four) hours.    Marland Kitchen PAROXETINE HCL 20 MG PO TABS  TAKE 1 TABLET EVERY DAY 30 tablet 5  . PREGABALIN 75 MG PO CAPS Oral Take 75 mg by mouth 3 (three) times daily.    Marland Kitchen ZOLPIDEM TARTRATE 10 MG PO TABS Oral Take 1 tablet (10 mg total) by mouth at bedtime as needed. 30 tablet 5    There were no vitals taken for this visit.  Physical Exam  Nursing note and vitals reviewed. Constitutional: He is oriented to person, place, and time. He appears well-developed and well-nourished. No distress.       Awake, alert, nontoxic appearance  HENT:  Head: Normocephalic and atraumatic.  Mouth/Throat:  Oropharynx is clear and moist. No oropharyngeal exudate.  Eyes: Conjunctivae normal and EOM are normal. Pupils are equal, round, and reactive to light. Right eye exhibits no discharge. Left eye exhibits no discharge.  Neck: Normal range of motion. Neck supple.       No nuchal rigidity  Cardiovascular: Normal rate and regular rhythm.   Pulmonary/Chest: Effort normal. No respiratory distress. He has no wheezes. He exhibits no tenderness.  Abdominal: Soft. Bowel sounds are normal. There is no tenderness. There is no rebound.  Musculoskeletal: Normal range of motion. He exhibits no edema and no tenderness.       ROM appears intact, no obvious focal weakness  Neurological: He is alert and oriented to person, place, and time. He displays normal reflexes. No cranial nerve deficit. He exhibits normal muscle tone. Coordination normal. GCS eye subscore is 4. GCS verbal subscore is 5. GCS motor subscore is 6.  Reflex Scores:      Patellar reflexes are 2+ on the right side and 2+ on the left side. Skin: Skin is warm and dry. No rash noted.  Psychiatric: He has a normal mood and affect. His behavior is normal.    ED Course  Procedures (including critical care time)  Labs Reviewed - No data to display No results found.   No diagnosis found.   Date: 06/15/2012  Rate: 58  Rhythm: normal sinus rhythm  QRS Axis: normal  Intervals: normal  ST/T Wave abnormalities: normal  Conduction Disutrbances:none  Narrative Interpretation:   Old EKG Reviewed: no significant changes  Results for orders placed during the hospital encounter of 06/15/12  POCT I-STAT, CHEM 8      Component Value Range   Sodium 136  135 - 145 mEq/L   Potassium 4.6  3.5 - 5.1 mEq/L   Chloride 101  96 - 112 mEq/L   BUN 32 (*) 6 - 23 mg/dL   Creatinine, Ser 3.08 (*) 0.50 - 1.35 mg/dL   Glucose, Bld 82  70 - 99 mg/dL   Calcium, Ion 6.57  8.46 - 1.23 mmol/L   TCO2 22  0 - 100 mmol/L   Hemoglobin 15.0  13.0 - 17.0 g/dL   HCT 96.2   95.2 - 84.1 %  CBC WITH DIFFERENTIAL      Component Value Range   WBC 11.6 (*) 4.0 - 10.5 K/uL   RBC 4.39  4.22 - 5.81 MIL/uL   Hemoglobin 14.9  13.0 - 17.0 g/dL   HCT 32.4  40.1 - 02.7 %   MCV 96.1  78.0 - 100.0 fL   MCH 33.9  26.0 - 34.0 pg   MCHC 35.3  30.0 - 36.0 g/dL   RDW 16.1  09.6 - 04.5 %   Platelets 221  150 - 400 K/uL   Neutrophils Relative 68  43 - 77 %   Neutro Abs 7.9 (*) 1.7 - 7.7 K/uL   Lymphocytes Relative 20  12 - 46 %   Lymphs Abs 2.4  0.7 - 4.0 K/uL   Monocytes Relative 11  3 - 12 %   Monocytes Absolute 1.3 (*) 0.1 - 1.0 K/uL   Eosinophils Relative 1  0 - 5 %   Eosinophils Absolute 0.1  0.0 - 0.7 K/uL   Basophils Relative 0  0 - 1 %   Basophils Absolute 0.0  0.0 - 0.1 K/uL  COMPREHENSIVE METABOLIC PANEL      Component Value Range   Sodium 135  135 - 145 mEq/L   Potassium 4.6  3.5 - 5.1 mEq/L   Chloride 99  96 - 112 mEq/L   CO2 21  19 - 32 mEq/L   Glucose, Bld 79  70 - 99 mg/dL   BUN 32 (*) 6 - 23 mg/dL   Creatinine, Ser 4.09 (*) 0.50 - 1.35 mg/dL   Calcium 9.1  8.4 - 81.1 mg/dL   Total Protein 7.3  6.0 - 8.3 g/dL   Albumin 4.0  3.5 - 5.2 g/dL   AST 39 (*) 0 - 37 U/L   ALT 56 (*) 0 - 53 U/L   Alkaline Phosphatase 79  39 - 117 U/L   Total Bilirubin 0.5  0.3 - 1.2 mg/dL   GFR calc non Af Amer 32 (*) >90 mL/min   GFR calc Af Amer 37 (*) >90 mL/min  ETHANOL      Component Value Range   Alcohol, Ethyl (B) <11  0 - 11 mg/dL   Ct Head Wo Contrast  06/15/2012  *RADIOLOGY REPORT*  Clinical Data: Syncope.  CT HEAD WITHOUT CONTRAST  Technique:  Contiguous axial images were obtained from the base of the skull through the vertex without contrast.  Comparison: Head CT 11/27/2009.  Findings: No acute intracranial abnormalities.  Specifically, no definite vascular territory acute/subacute cerebral ischemia is identified.  No acute intracranial hemorrhage, no focal mass, no mass effect, hydrocephalus or abnormal intra or extra-axial fluid collections.  No acute  displaced skull fractures are identified. Visualized paranasal sinuses and mastoids are generally well pneumatized, with exception of a small air-fluid level in the posterior aspect of the left maxillary sinus.  IMPRESSION: 1.  No acute intracranial abnormalities. 2.  The appearance of the brain is normal. 3.  Small air-fluid level in the left maxillary sinus.  Clinical correlation for signs and symptoms of sinusitis is recommended.   Original Report Authenticated By: Trudie Reed, M.D.       1. Orthostatic hypotension 2. Alcohol withdrawal 3. syncope  MDM  Pt with multiple bouts of near syncope and syncope that appears to be orthostatic related.  BP in the 80-90s systolic.  Likely orthostatic.  Otherwise no CP/SOB.  Does endorse mild headache after syncopal episode today.  Head CT, ECG, istat8 ordered.  IVF given.  Discussed with my attending.  Does take chronic narcotic meds, but doubt that this is the cause.    5:57 PM Family members reports that pt has hx of alcohol abuse, but has been trying to quit cold [place] for the past several days.  Sts he's not drinking  or eating much for several days.  Pt also did take his xanax after being out of meds for 2-3 days. Sts pt wants to seek for help but does not want his narcotic meds to be taken away.    6:50 PM Pt is orthostatic hypotensive.  Pt is showing signs of withdrawal with increase anxiety and tremor.  Will check basic labs, continues IVF, give ativan, and will medically admit pt for further management.  Pt does wants alcohol detox, but he is not medically cleared.    8:35 PM BP 90s systolic after 2L NS fluid.  Pt currently in NAD.  I have consulted with [facility] who agrees to see pt in ED for admission.    10:52 PM Pt will be admitted by [facility].  Pt currently stable.    CRITICAL CARE Performed by: Fayrene Helper   Total critical care time: 40 min  Critical care time was exclusive of separately billable procedures and treating other  patients.  Critical care was necessary to treat or prevent imminent or life-threatening deterioration.  Critical care was time spent personally by me on the following activities: development of treatment plan with patient and/or surrogate as well as nursing, discussions with consultants, evaluation of patient's response to treatment, examination of patient, obtaining history from patient or surrogate, ordering and performing treatments and interventions, ordering and review of laboratory studies, ordering and review of radiographic studies, pulse oximetry and re-evaluation of patient's condition.  BP 109/72  Pulse 61  Temp 98 F (36.7 C) (Oral)  Resp 18  SpO2 98%  I have reviewed nursing notes and vital signs. I personally reviewed the imaging tests through PACS system  I reviewed available ER/hospitalization records thought the EMR    Fayrene Helper, [place] 06/15/12 2253

## 2012-06-15 NOTE — ED Provider Notes (Signed)
Medical screening examination/treatment/procedure(s) were conducted as a shared visit with non-physician practitioner(s) and myself.  I personally evaluated the patient during the encounter  This 51 year old male has several months of lightheadedness whenever he stands up with position changes with orthostasis with presyncope as well as several episodes of syncope, his most recent syncopal spell was a few days ago and the son witnessed the patient trying to walk into the kitchen the patient was lightheaded and the patient had brief witnessed syncope and didn't hit his head, the patient has had some mild intermittent headaches since that time. Patient has no neck pain no cervical spine tenderness, the patient is chronic severe pain to his low back and pelvis and legs with chronic neuropathy in his feet, he is no chest pain no shortness breath no apparent new focal neurologic Sxs and no sudden or severe headache.  He was presyncopal at his doctor's office with hypotension so sent to ED.  Hurman Horn, MD 06/17/12 [phone]

## 2012-06-15 NOTE — Progress Notes (Signed)
Subjective:    Patient ID: Leonard Murray, male    DOB: 09-12-1960, 51 y.o.   MRN: [id number]  HPI patient has been having recurrent episodes of syncope last few days. His pain medications were recently changed by his pain management physician. Last night he had about 10-15 minutes of heartburn and apparently had very poor color according to his girlfriend . He denies any true substernal chest discomfort.    Review of Systems    Past Medical History  Diagnosis Date  . CAD (coronary artery disease) 12/2007    AMI with stenting; retinal embolus 08/2005; stenting 03/2009  . Hyperlipidemia   . Hypertension   . OSA (obstructive sleep apnea)     not using CPAP-feels suffocating  . GAD (generalized anxiety disorder)   . Depression   . Chronic back pain     injuries 1998, 2008, 01/2009  . Peripheral neuropathy   . Hypogonadism male     can't afford testosterone    History reviewed. No pertinent past surgical history.  Prior to Admission medications   Current outpatient prescriptions:ALPRAZolam (XANAX) 1 MG tablet, Take 1 mg by mouth 3 (three) times daily as needed., Disp: , Rfl: ;  amitriptyline (ELAVIL) 75 MG tablet, TAKE 1 TABLET DAILY AT BEDTIME., Disp: 30 tablet, Rfl: 2;  amLODipine (NORVASC) 10 MG tablet, Take 10 mg by mouth daily., Disp: , Rfl: ;  aspirin 81 MG tablet, Take 81 mg by mouth daily., Disp: , Rfl: ;  clopidogrel (PLAVIX) 75 MG tablet, Take 75 mg by mouth daily., Disp: , Rfl:  isosorbide mononitrate (IMDUR) 60 MG 24 hr tablet, Take 60 mg by mouth daily., Disp: , Rfl: ;  lisinopril (PRINIVIL,ZESTRIL) 20 MG tablet, Take 20 mg by mouth daily., Disp: , Rfl: ;  nebivolol (BYSTOLIC) 10 MG tablet, Take 10 mg by mouth daily., Disp: , Rfl: ;  nitroGLYCERIN (NITROSTAT) 0.4 MG SL tablet, Place 1 tablet (0.4 mg total) under the tongue every 5 (five) minutes as needed., Disp: 30 tablet, Rfl: 1 oxyCODONE (OXYCONTIN) 10 MG 12 hr tablet, Take 10 mg by mouth every 12 (twelve) hours. 2 tablet  in AM and 1 tablet in PM, Disp: , Rfl: ;  Oxycodone HCl 10 MG TABS, Take 10 mg by mouth every 4 (four) hours., Disp: , Rfl: ;  PARoxetine (PAXIL) 20 MG tablet, TAKE 1 TABLET EVERY DAY, Disp: 30 tablet, Rfl: 5;  pregabalin (LYRICA) 75 MG capsule, Take 75 mg by mouth 3 (three) times daily., Disp: , Rfl:  zolpidem (AMBIEN) 10 MG tablet, Take 1 tablet (10 mg total) by mouth at bedtime as needed., Disp: 30 tablet, Rfl: 5;  niacin (NIASPAN) 500 MG CR tablet, Take 500 mg by mouth at bedtime., Disp: , Rfl:   No Known Allergies  History   Social History  . Marital Status: Divorced    Spouse Name: n/a    Number of Children: 1  . Years of Education: 11   Occupational History  . 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 Smoking status: Current Every Day Smoker -- 1.0 packs/day    Types: Cigarettes  . Smokeless tobacco: Not on file  . Alcohol Use: Yes  . Drug Use: No  . Sexually Active: Yes -- Male partner(s)   Other Topics Concern  . Not on file   Social History Narrative   Lives with girlfriend.  Adult son with history of substance abuse.  Has applied  for disability.    History reviewed. No pertinent family history.     Objective:   Physical Exam patient apparently had a near syncopal episode out in the lobby. He was brought back in a wheelchair. When I first saw the patient his lying blood pressure was 76/60. He was conversant oriented. His chest is clear to auscultation. His cardiac exam is unremarkable  EKG shows loss of R wave in lead V3 no acute ST-T change      Assessment & Plan:   1. Syncope  EKG 12-Lead  2. Hypotension    3. CAD (coronary artery disease)       I suspect these symptoms are medication related. He is high risk for cardiac disease and has had previous stents placed, IV attempted, but not obtained here; IV then will be inserted by EMS he will be transported to the hospital. IV access was obtained prior to transfer.  Discuss the possibility of Narcan on the way to the hospital to reverse the effects of his OxyContin.

## 2012-06-15 NOTE — H&P (Signed)
Leonard Murray is an 51 y.o. male.   Chief Complaint: syncope and alcohol withdrawal HPI:  51 yo male with h/o CAD, HLD, HTN, chronic pain, anxiety and alcohol abuse who presented to the ED for evaluation of syncopal events and was found to be hypotensive once inED. Reports that for a couple of months, he has been having episodes of light headedness, darkened vision and occasional passing out when he goes from the squatting or sitting position to standing. More recently, episodes have become more frequent and more often associated with brief episodes (45secs) of loss of consciousness associated with flailing of his arms. Denies any loss of urine or bowel during these episodes. He denies ever hitting his head because he is generally caught by someone before he falls to the ground. His girlfriend reports that after these episodes he seems out of it for a couple of seconds. He denies any chest pain or shortness of breath associated with these episodes. He has been having some right sided neck ache for several weeks. He denies any chest pain, shortness of breath, lower extremity swelling. Denies fevers or chills. He has not been eating or drinking fluids well for the last 2 days.  Of note, he has not had alcohol for 72 hours. He has a history of drinking about 10 large cans of beer and about a week ago had dropped to 3-4 large cans of beer, before stopping cold [place] 3 days ago. He also is on chronic xanax which he had not been able to afford and definitely missed doses in the last 2 days. He was able to take a dose this morning. His girlfriend does report that he has been more agitated and more confused.   In the ED, he was found to have orthostatic hypotension and have BP as low as 60/38. He was given 2L NS bolus with improvement of BP to 109/72. He was also found to be agitated for which he was given 1mg  aitvan for DT prophylaxis. Patient was admitted to [facility] for hypotension and likely DT's management.   Past  Medical History  Diagnosis Date  . CAD (coronary artery disease) 12/2007    AMI with stenting; retinal embolus 08/2005; stenting 03/2009  . Hyperlipidemia   . Hypertension   . OSA (obstructive sleep apnea)     not using CPAP-feels suffocating  . GAD (generalized anxiety disorder)   . Depression   . Chronic back pain     injuries 1998, 2008, 01/2009  . Peripheral neuropathy   . Hypogonadism male     can't afford testosterone    No past surgical history on file.  No family history on file. Social History:  reports that he has been smoking Cigarettes.  He has been smoking about 1 pack per day. He does not have any smokeless tobacco history on file. He reports that he drinks alcohol. He reports that he does not use illicit drugs.  Allergies: No Known Allergies   (Not in a hospital admission)  Results for orders placed during the hospital encounter of 06/15/12 (from the past 48 hour(s))  POCT I-STAT, CHEM 8     Status: Abnormal   Collection Time   06/15/12  5:42 PM      Component Value Range Comment   Sodium 136  135 - 145 mEq/L    Potassium 4.6  3.5 - 5.1 mEq/L    Chloride 101  96 - 112 mEq/L    BUN 32 (*) 6 - 23 mg/dL  Creatinine, Ser 2.10 (*) 0.50 - 1.35 mg/dL    Glucose, Bld 82  70 - 99 mg/dL    Calcium, Ion 4.78  2.95 - 1.23 mmol/L    TCO2 22  0 - 100 mmol/L    Hemoglobin 15.0  13.0 - 17.0 g/dL    HCT 62.1  30.8 - 65.7 %   CBC WITH DIFFERENTIAL     Status: Abnormal   Collection Time   06/15/12  6:48 PM      Component Value Range Comment   WBC 11.6 (*) 4.0 - 10.5 K/uL    RBC 4.39  4.22 - 5.81 MIL/uL    Hemoglobin 14.9  13.0 - 17.0 g/dL    HCT 84.6  96.2 - 95.2 %    MCV 96.1  78.0 - 100.0 fL    MCH 33.9  26.0 - 34.0 pg    MCHC 35.3  30.0 - 36.0 g/dL    RDW 84.1  32.4 - 40.1 %    Platelets 221  150 - 400 K/uL    Neutrophils Relative 68  43 - 77 %    Neutro Abs 7.9 (*) 1.7 - 7.7 K/uL    Lymphocytes Relative 20  12 - 46 %    Lymphs Abs 2.4  0.7 - 4.0 K/uL     Monocytes Relative 11  3 - 12 %    Monocytes Absolute 1.3 (*) 0.1 - 1.0 K/uL    Eosinophils Relative 1  0 - 5 %    Eosinophils Absolute 0.1  0.0 - 0.7 K/uL    Basophils Relative 0  0 - 1 %    Basophils Absolute 0.0  0.0 - 0.1 K/uL   COMPREHENSIVE METABOLIC PANEL     Status: Abnormal   Collection Time   06/15/12  6:48 PM      Component Value Range Comment   Sodium 135  135 - 145 mEq/L    Potassium 4.6  3.5 - 5.1 mEq/L    Chloride 99  96 - 112 mEq/L    CO2 21  19 - 32 mEq/L    Glucose, Bld 79  70 - 99 mg/dL    BUN 32 (*) 6 - 23 mg/dL    Creatinine, Ser 0.27 (*) 0.50 - 1.35 mg/dL    Calcium 9.1  8.4 - 25.3 mg/dL    Total Protein 7.3  6.0 - 8.3 g/dL    Albumin 4.0  3.5 - 5.2 g/dL    AST 39 (*) 0 - 37 U/L    ALT 56 (*) 0 - 53 U/L    Alkaline Phosphatase 79  39 - 117 U/L    Total Bilirubin 0.5  0.3 - 1.2 mg/dL    GFR calc non Af Amer 32 (*) >90 mL/min    GFR calc Af Amer 37 (*) >90 mL/min   ETHANOL     Status: Normal   Collection Time   06/15/12  6:48 PM      Component Value Range Comment   Alcohol, Ethyl (B) <11  0 - 11 mg/dL    Ct Head Wo Contrast  06/15/2012  *RADIOLOGY REPORT*  Clinical Data: Syncope.  CT HEAD WITHOUT CONTRAST  Technique:  Contiguous axial images were obtained from the base of the skull through the vertex without contrast.  Comparison: Head CT 11/27/2009.  Findings: No acute intracranial abnormalities.  Specifically, no definite vascular territory acute/subacute cerebral ischemia is identified.  No acute intracranial hemorrhage, no focal mass, no mass effect, hydrocephalus  or abnormal intra or extra-axial fluid collections.  No acute displaced skull fractures are identified. Visualized paranasal sinuses and mastoids are generally well pneumatized, with exception of a small air-fluid level in the posterior aspect of the left maxillary sinus.  IMPRESSION: 1.  No acute intracranial abnormalities. 2.  The appearance of the brain is normal. 3.  Small air-fluid level in the  left maxillary sinus.  Clinical correlation for signs and symptoms of sinusitis is recommended.   Original Report Authenticated By: Trudie Reed, M.D.     Review of Systems  Constitutional: Negative for fever, chills and malaise/fatigue.  All other systems reviewed and are negative.    Blood pressure 109/72, pulse 61, temperature 98 F (36.7 C), temperature source Oral, resp. rate 18, SpO2 98.00%. Physical Exam  Constitutional: He is oriented to person, place, and time.       Drowsy and falling asleep, unable to always answer to questions secondary to drowsiness  HENT:  Head: Normocephalic.       Dry mucous membranes  Neck: Normal range of motion. Neck supple.  Cardiovascular: Normal rate, regular rhythm and normal heart sounds.   No murmur heard. Respiratory: Effort normal and breath sounds normal. No respiratory distress. He has no wheezes.  GI: Soft. Bowel sounds are normal. He exhibits distension. There is no tenderness. There is no rebound and no guarding.  Musculoskeletal: Normal range of motion. He exhibits no edema.  Neurological: He is alert and oriented to person, place, and time.       A+Ox3 but drowsy and falling asleep at times. Episodes of confusion and talking about unrelated topics.  5/5 strength in upper and lower extremities bilaterally. CN2-12 grossly intact. No clonus, negative babinski.   Skin: Skin is warm and dry. No rash noted.  Psychiatric:       Agitated at times     Assessment/Plan 51 yo male with h/o CAD, HTN< HLD, chronic pain, generalized anxiety disorder and alcohol abuse who presented with worsening syncopal episodes, hypotension and alcohol/benzo withdrawal symptoms.  1. Syncopal episodes: given history, appear related to orthostatic hypotension. Unlikely seizure activity given such short duration in nature and atypical presentation. Head CT on admission was negative for intracranial process.  - treat hypotension - continue to monitor neuro  status and consider neuro consult if persistent symptoms once hypotension resolved - PT/OT 2. Alcohol and benzo withdrawal: last drink over 72 hours ago with known heavy chronic alcohol abuse and benzo use. Patient appeared agitated suggesting a component of withdrawal.   - start ativan 1mg  every 4 hours scheduled  - ciwa protocol for monitoring - banana bag at 125 cc/hr 3. Orthostatic hypotension: on multiple blood pressure medicines for CAD and HTN treatment. Patient compliant with medicine. Likely triggered by dehydration given patient's history of 2 days of poor po intake.  - hold home nebivolol, lisinopril, hctz, norvasc, imdur for now. Will start back medicine as soon as bp stabilizes - bolus as needed. No known history of CHF - maintenance fluids at 125cc/hr - obtain troponin  4. Acute renal failure: from creatinine in March 2013 of 0.8. Likely pre-renal given recent poor intake - obtain FeNa to rule out ATN - monitor urine output - obtain CK in the context of recent falls to exclude rhabdo as reason for acute renal failure - IV hydration 5. H/o chronic pain: on chronic opiates at home - restart home lyrica - start back home oxycodone. Can start back oxycontin 20mg  12hr release later in hospitalization  if needed.  6. Elevated LFT's: mildly elevated in context of chronic alcohol use 7. H/o sleep apnea: not compliant with CPAP at home - consider CPAP while in hospital - monitor intermittent LFT's 8. H/o CAD: Hold antihypertensive meds for now. Continue plavix and asa 9. Fen/Gi:  npo except for sips and chips while drowsy and less cooperative 10. PPx: Heparin Dispo:  Admit to step down for management of hypotension and alcohol withdrawal. Patient and his girlfriend updated about plan and agree.  Marena Chancy 06/15/2012, 10:42 PM Family Medicine Teaching Service [phone]

## 2012-06-15 NOTE — ED Notes (Signed)
Patient's wife and son expressed their concern that the patient is a drinker and has not had any alcohol in the past 3 days and he took his xanax for the first time in two days. Physician informed.

## 2012-06-15 NOTE — ED Notes (Signed)
Patient arrived via EMS from Urgent Care with hypotension and syncopal episodes. Patient states this has been going on for several months. He states his physician has changed his medications and he has been out of his xanax and just restarted it.

## 2012-06-15 NOTE — ED Notes (Signed)
Pt unable to void at this time pt is aware of needed void urinal at bedside side

## 2012-06-15 NOTE — ED Notes (Signed)
Pt still unable to void, urinal at bedside side

## 2012-06-16 ENCOUNTER — Encounter (HOSPITAL_COMMUNITY): Payer: Self-pay | Admitting: Neurology

## 2012-06-16 ENCOUNTER — Inpatient Hospital Stay (HOSPITAL_COMMUNITY): Payer: PRIVATE HEALTH INSURANCE

## 2012-06-16 DIAGNOSIS — I951 Orthostatic hypotension: Principal | ICD-10-CM

## 2012-06-16 DIAGNOSIS — F1011 Alcohol abuse, in remission: Secondary | ICD-10-CM | POA: Diagnosis present

## 2012-06-16 DIAGNOSIS — I2510 Atherosclerotic heart disease of native coronary artery without angina pectoris: Secondary | ICD-10-CM

## 2012-06-16 DIAGNOSIS — N170 Acute kidney failure with tubular necrosis: Secondary | ICD-10-CM

## 2012-06-16 DIAGNOSIS — F10939 Alcohol use, unspecified with withdrawal, unspecified: Secondary | ICD-10-CM

## 2012-06-16 DIAGNOSIS — F10239 Alcohol dependence with withdrawal, unspecified: Secondary | ICD-10-CM

## 2012-06-16 LAB — RAPID URINE DRUG SCREEN, HOSP PERFORMED
Amphetamines: NOT DETECTED
Barbiturates: NOT DETECTED
Benzodiazepines: NOT DETECTED
Tetrahydrocannabinol: POSITIVE — AB

## 2012-06-16 LAB — CORTISOL: Cortisol, Plasma: 22.5 ug/dL

## 2012-06-16 LAB — MRSA PCR SCREENING: MRSA by PCR: NEGATIVE

## 2012-06-16 LAB — CBC
HCT: 39.7 % (ref 39.0–52.0)
Hemoglobin: 14.1 g/dL (ref 13.0–17.0)
WBC: 8.4 10*3/uL (ref 4.0–10.5)

## 2012-06-16 LAB — BASIC METABOLIC PANEL
Chloride: 103 mEq/L (ref 96–112)
GFR calc Af Amer: 79 mL/min — ABNORMAL LOW (ref >90)
Potassium: 4.1 mEq/L (ref 3.5–5.1)

## 2012-06-16 LAB — URINALYSIS, MICROSCOPIC ONLY
Bilirubin Urine: NEGATIVE
Glucose, UA: NEGATIVE mg/dL
Ketones, ur: NEGATIVE mg/dL
Leukocytes, UA: NEGATIVE
Protein, ur: NEGATIVE mg/dL

## 2012-06-16 LAB — TROPONIN I
Troponin I: <0.3 ng/mL (ref <0.30)
Troponin I: <0.3 ng/mL (ref <0.30)

## 2012-06-16 LAB — GLUCOSE, CAPILLARY: Glucose-Capillary: 112 mg/dL — ABNORMAL HIGH (ref 70–99)

## 2012-06-16 MED ORDER — PANTOPRAZOLE SODIUM 40 MG IV SOLR
40.0000 mg | INTRAVENOUS | Status: DC
  Administered 2012-06-16: 40 mg via INTRAVENOUS
  Filled 2012-06-16 (×3): qty 40

## 2012-06-16 MED ORDER — OXYCODONE HCL 20 MG PO TB12
20.0000 mg | ORAL_TABLET | Freq: Two times a day (BID) | ORAL | Status: DC
  Filled 2012-06-16 (×2): qty 1

## 2012-06-16 MED ORDER — PAROXETINE HCL 20 MG PO TABS
20.0000 mg | ORAL_TABLET | Freq: Every day | ORAL | Status: DC
  Administered 2012-06-16 – 2012-06-17 (×2): 20 mg via ORAL
  Filled 2012-06-16 (×2): qty 1

## 2012-06-16 MED ORDER — OXYCODONE HCL 10 MG PO TABS: 10.0000 mg | ORAL_TABLET | ORAL | Status: DC | PRN

## 2012-06-16 MED ORDER — SODIUM CHLORIDE 0.9 % IV BOLUS (SEPSIS): 1000.0000 mL | Freq: Once | INTRAVENOUS | Status: DC

## 2012-06-16 MED ORDER — COSYNTROPIN 0.25 MG IJ SOLR
0.2500 mg | Freq: Once | INTRAMUSCULAR | Status: AC
  Administered 2012-06-16: 0.25 mg via INTRAVENOUS
  Filled 2012-06-16: qty 0.25

## 2012-06-16 MED ORDER — OXYCODONE HCL 5 MG PO TABS
10.0000 mg | ORAL_TABLET | ORAL | Status: DC | PRN
  Administered 2012-06-16 (×4): 10 mg via ORAL
  Filled 2012-06-16 (×3): qty 2
  Filled 2012-06-16 (×2): qty 1

## 2012-06-16 MED ORDER — OXYCODONE HCL ER 10 MG PO T12A
10.0000 mg | EXTENDED_RELEASE_TABLET | Freq: Every day | ORAL | Status: DC
  Administered 2012-06-16 – 2012-06-17 (×2): 10 mg via ORAL
  Filled 2012-06-16 (×2): qty 1

## 2012-06-16 MED ORDER — LORAZEPAM 2 MG/ML IJ SOLN
1.0000 mg | Freq: Every evening | INTRAMUSCULAR | Status: DC | PRN
  Administered 2012-06-16: 1 mg via INTRAVENOUS
  Filled 2012-06-16: qty 1

## 2012-06-16 MED ORDER — DEXTROSE-NACL 5-0.45 % IV SOLN
INTRAVENOUS | Status: DC
  Administered 2012-06-16: 09:00:00 via INTRAVENOUS

## 2012-06-16 MED ORDER — SODIUM CHLORIDE 0.9 % IV SOLN: INTRAVENOUS | Status: DC

## 2012-06-16 NOTE — Progress Notes (Signed)
Transfer report called to [phone] RN. Patient prepared for transfer via wheelchair on monitor. VSS, a/o, pain controlled by PRN's. Lurline Idol [facility]

## 2012-06-16 NOTE — Progress Notes (Signed)
FMTS Daily Intern Progress Note  Subjective:  Feeling agitated and unhappy. Pain in lower legs. No chest pain, no shortness of breath.  Making urine this morning  I have reviewed the patient's medications.  Objective Temp:  [97.4 F (36.3 C)-98 F (36.7 C)] 97.6 F (36.4 C) (11/02 0746) Pulse Rate:  [53-74] 55  (11/02 0700) Resp:  [11-21] 13  (11/02 0700) BP: (60-123)/(38-73) 98/51 mmHg (11/02 0700) SpO2:  [93 %-100 %] 97 % (11/02 0700) Weight:  [215 lb 9.8 oz (97.8 kg)] 215 lb 9.8 oz (97.8 kg) (11/02 0357)   Intake/Output Summary (Last 24 hours) at 06/16/12 0910 Last data filed at 06/16/12 0229  Gross per 24 hour  Intake   2000 ml  Output      0 ml  Net   2000 ml    CBG (last 3)   Basename 06/16/12 0357  GLUCAP 112*    General: alert and oriented x3, no acute distress HEENT: PERRLA, EOMI CV: S1S2, rrr, no murmur appreciated Pulm: cta b/l Abd: present bowel sounds, distended, non tender Ext: no edema Neuro: mildly agitated, CN2-12 grossly intact  Labs and Imaging  Lab 06/16/12 0500 06/15/12 1848 06/15/12 1742  WBC 8.4 11.6* --  HGB 14.1 14.9 15.0  HCT 39.7 42.2 44.0  PLT 201 221 --     Lab 06/16/12 0500 06/15/12 1848 06/15/12 1742  NA 134* 135 136  K 4.1 4.6 4.6  CL 103 99 101  CO2 23 21 --  BUN 25* 32* 32*  CREATININE 1.19 2.22* 2.10*  LABGLOM -- -- --  GLUCOSE 123* -- --  CALCIUM 8.8 9.1 --    Assessment and Plan 51 yo male with h/o CAD with stent, HTN, HLD, chronic pain, generalized anxiety disorder and alcohol abuse who presented with worsening syncopal episodes, hypotension and alcohol/benzo withdrawal symptoms.  1. Hypotension: likely multifactorial with combination of poor po, multiple HTN medicines and opiate use. responded to IV fluids although still low. No evidence of ischemic demand from hypotension given normal troponin.  - continue to monitor - continue with IV resuscitation - CCM aware of patient - holding nebivolol, lisinopril,  hctz, norvasc, imdur for now 2. Alcohol and benzo withdrawal: last drink over 72 hours ago with known heavy chronic alcohol abuse and benzo use. Patient appeared agitated suggesting a component of withdrawal although bradycardia and hypotension do not entirely support withdrawal.  - start ativan 1mg  every 4 hours scheduled  - ciwa protocol for monitoring  - banana bag at 125 cc/hr followed by D51/2NS at 125cc/hr 3. Syncopal episodes: given history, appear related to orthostatic hypotension. Unlikely seizure activity given such short duration in nature and atypical presentation. Head CT on admission was negative for intracranial process.  - treat hypotension  - continue to monitor neuro status and consider neuro consult if persistent symptoms once hypotension resolved  - PT/OT  4. Acute renal failure: from creatinine in March 2013 of 0.8. Renal function improving since yesterday from 2.2 to 1.19 after IV hydration. Normal CK making rhabdo unlikely. Making urine   - likely prerenal but will follow up on FeNa to rule out ATN  - monitor urine output  - IV hydration  5. H/o chronic pain: on chronic opiates at home  - restart home lyrica  - start back home oxycodone. Will start back oxycontin 20 12hr given significant pain and agitation. Monitor BP while on narcotics.  6. Elevated LFT's: mildly elevated in context of chronic alcohol use  - am  CMP 7. H/o sleep apnea: not compliant with CPAP at home  - consider CPAP while in hospital  8. H/o CAD:  Hold antihypertensive meds for now. Continue plavix and asa. Will make Dr. Tresa Endo aware of hospitalization 9. Fen/Gi:  npo except for sips and chips. Speech eval this am to evaluate for diet.  10. PPx:  Heparin  Dispo:  Currently on ICU step down. Patient and girlfriend updated.   Marena Chancy Pager: [phone] 06/16/2012, 9:10 AM

## 2012-06-16 NOTE — Progress Notes (Signed)
Pt requests home meds; MD notified; new orders received

## 2012-06-16 NOTE — Progress Notes (Signed)
FMTS Attending Daily Note: Denny Levy MD [phone] pager office [phone] I  have seen and examined this patient, reviewed their chart. I have discussed this patient with the resident. I agree with the resident's findings, assessment and care plan. Patient has hx of AICD inneck---want to rule out myelopathy as source of his falls so will get MRI c spine.

## 2012-06-16 NOTE — Progress Notes (Signed)
Observed patient's swallowing at bedside with thin liquids. Patient encouraged to go slowly and take small sips. He is able to tolerate liquids and swallow his oral medications without difficulty.Lurline Idol [facility]

## 2012-06-16 NOTE — H&P (Signed)
FMTS Attending Admission Note: Leonard Neal MD [phone] pager office [phone] I  have seen and examined this patient, reviewed their chart. I have discussed this patient with the resident. I agree with the resident's findings, assessment and care plan. 

## 2012-06-16 NOTE — Evaluation (Signed)
Physical Therapy Evaluation Patient Details Name: Leonard Murray MRN: [id number] DOB: Nov 26, 1960 Today's Date: 06/16/2012 Time: 6440-3474 PT Time Calculation (min): 25 min  PT Assessment / Plan / Recommendation Clinical Impression  Pt. s/p alcohol withdrawal, orthostatic hypotension and syncope with decr mobility secondary to decr endurance for activity as well as orthostatic hypotension.      PT Assessment  Patient needs continued PT services    Follow Up Recommendations  No PT follow up;Supervision/Assistance - 24 hour    Does the patient have the potential to tolerate intense rehabilitation      Barriers to Discharge        Equipment Recommendations  None recommended by PT    Recommendations for Other Services     Frequency Min 3X/week    Precautions / Restrictions Precautions Precautions: Fall Restrictions Weight Bearing Restrictions: No   Pertinent Vitals/Pain  Some pain Orthostatic BPs  Supine 112/65,58  Sitting 97/65,57  Walking 75/41,63  Sitting in chair on departure 104/57,64        Mobility  Bed Mobility Bed Mobility: Supine to Sit Supine to Sit: 4: Min assist;With rails;HOB elevated Transfers Transfers: Sit to Stand;Stand to Sit Sit to Stand: 4: Min guard;With upper extremity assist;From bed Stand to Sit: 4: Min guard;With upper extremity assist;To chair/3-in-1;With armrests Details for Transfer Assistance: cues for hand placement Ambulation/Gait Ambulation/Gait Assistance: 4: Min guard Ambulation Distance (Feet): 125 Feet Assistive device: Rolling walker Ambulation/Gait Assistance Details: Pt. ambulates well with steady gait.  Pt. orthostatic but not symptomatic.  Had to sit patient down due to need for a blood draw.  Since patient was orthostatic and given that MD came into see pt after blood draw, did not ambulated patient any more.   Gait Pattern: Step-through pattern Stairs: No Wheelchair Mobility Wheelchair Mobility: No              PT  Diagnosis: Generalized weakness  PT Problem List: Decreased mobility;Decreased knowledge of use of DME;Decreased activity tolerance PT Treatment Interventions: DME instruction;Gait training;Functional mobility training;Therapeutic activities;Stair training;Therapeutic exercise;Balance training;Patient/family education   PT Goals Acute Rehab PT Goals PT Goal Formulation: With patient Time For Goal Achievement: 06/23/12 Potential to Achieve Goals: Good Pt will Ambulate: >150 feet;with modified independence;with least restrictive assistive device PT Goal: Ambulate - Progress: Goal set today Pt will Go Up / Down Stairs: 3-5 stairs;with modified independence;with least restrictive assistive device PT Goal: Up/Down Stairs - Progress: Goal set today Additional Goals Additional Goal #1: Pt to perform Berg test with score of >50/56.   PT Goal: Additional Goal #1 - Progress: Goal set today  Visit Information  Last PT Received On: 06/16/12 Assistance Needed: +1    Subjective Data  Subjective: "I feel like I need to get up." Patient Stated Goal: To go home   Prior Functioning  Home Living Lives With: Significant other Available Help at Discharge: Available 24 hours/day;Friend(s) Type of Home: House Home Access: Stairs to enter [employer] of Steps: 3 Entrance Stairs-Rails: None Home Layout: Two level;Able to live on main level with bedroom/bathroom Alternate Level Stairs-Number of Steps: doesnt use the upstairs Bathroom Shower/Tub: Tub/shower unit;Door Dentist: None Prior Function Level of Independence: Independent Able to Take Stairs?: Yes Driving: Yes Vocation: Unemployed Comments: trying to get disability Communication Communication: No difficulties Dominant Hand: Right    Cognition  Overall Cognitive Status: Impaired Area of Impairment: Memory;Safety/judgement;Problem solving Arousal/Alertness: Awake/alert Orientation  Level: Disoriented to;Time Behavior During Session: WFL for tasks performed (initially  lethargic but awake by end of rx) Safety/Judgement: Decreased awareness of safety precautions;Decreased safety judgement for tasks assessed;Decreased awareness of need for assistance    Extremity/Trunk Assessment Right Lower Extremity Assessment RLE ROM/Strength/Tone: [facility] for tasks assessed Left Lower Extremity Assessment LLE ROM/Strength/Tone: [facility] for tasks assessed Trunk Assessment Trunk Assessment: Normal   Balance Static Standing Balance Static Standing - Balance Support: Bilateral upper extremity supported;During functional activity Static Standing - Level of Assistance: 5: Stand by assistance Static Standing - Comment/# of Minutes: 2 minutes with steady stance.  End of Session PT - End of Session Equipment Utilized During Treatment: Gait belt Activity Tolerance: Patient tolerated treatment well Patient left: in chair;with call bell/phone within reach;with family/visitor present Nurse Communication: Mobility status       INGOLD,Bilan Tedesco 06/16/2012, 3:22 PM  [facility] [phone] (pager)

## 2012-06-16 NOTE — Progress Notes (Signed)
Interim Note:  Called by nurse about patient being anxious and unhappy with [place] withholding his chronic narcotics. States he goes to [facility] and the MD increased his regimen last week (10/25) from oxycontin 10 mg TID to 20 mg BID. He was also continued on chronic regimen oxycodone IR 10 mg q4hrs and xanax 1mg  TID as well, also prescribed at pain clinic. Additionally, he states his problem started on 10/25 when he dropped his bag of medicine on the floor, spilling all his xanax tablets out. Then he says someone may have stolen the xanax. He relates his current admission and symptoms to running out of xanax.   Notably his UDS was negative for BZD and opiates. +THC only.   He states he feels well currently, only complaint is that his legs feel greasy. His voice sounds anxious. His BP has continued to be borderline low. He has been getting only oxy IR 10 mg QID and ativan 1 mg q4 hr. His recent CIWA score is 9. Will allow one extra dose of ativan 1mg  prn and start half home dose of oxycontin 10 mg to prevent withdrawal. I advised patient that we will not give him his home dose of pain meds.

## 2012-06-16 NOTE — Consult Note (Signed)
Name: Leonard Murray MRN: [id number] DOB: [birth year]    LOS: 1  REFERRING PRIVIDER:  Jennette Kettle  CHIEF COMPLAINT:  Hypotension    Brief patient description:  57 YOM admitted 11/1 w/ dehydration, acute renal failure, orthostatic hypotension and  syncopal events. Admitted to ICU as SDU overflow given concern for ETOH w/d.   Lines/tubes:   Cultures: MRSA screen 11/2: negative   Antibiotics:  Significant studies procedures and events:    Level of Care:  Critical care Primary Service:  FPTS Consultants:  CCM Code Status:  FULL  Diet:  NPO DVT Px:  [place] heparin  GI Px:  none  HISTORY OF PRESENT ILLNESS:   51 year old male w/ sig h/o CAD, HLD, HTN, chronic pain and ETOH abuse. Presented to the ER on 11/1 w/ CC: syncopal event. Reported that for a couple of months, he had been having episodes of light headedness, darkened vision and occasional passing out when he goes from the squatting or sitting position to standing. More recently, episodes have become more frequent and more often associated with brief episodes (45secs) of loss of consciousness associated with flailing of his arms. Denied any loss of urine or bowel during these episodes. He denied ever hitting his head because he is generally caught by someone before he falls to the ground. His girlfriend reported that after these episodes he seemed out of it for a couple of seconds. He denied any chest pain or shortness of breath associated with these episodes. On evaluation he was found to have orthostatic hypotension and have BP as low as 60/38. His lab work demonstrated Acute renal failure. He was given 2L NS bolus with improvement of BP to 109/72. He was also found to be agitated for which he was given 1mg  aitvan for DT prophylaxis. Patient was admitted to [facility] for hypotension and likely DT's management.   Of note, he has h/o drinking about 10 large cans of beer/day,  and about a week prior to admit dropped to 3-4 large cans of  beer, before stopping cold [place] 3 days PTA. He also is on chronic xanax which he had not been able to afford and definitely missed doses in the last 2 days.His girlfriend does report that he has been more agitated and more confused.   He was admitted to the ICU for IV hydration, evaluation of syncope and hypotension, as well as treatment of DTs.    PAST MEDICAL HISTORY :  Past Medical History  Diagnosis Date  . CAD (coronary artery disease) 12/2007    AMI with stenting; retinal embolus 08/2005; stenting 03/2009  . Hyperlipidemia   . Hypertension   . OSA (obstructive sleep apnea)     not using CPAP-feels suffocating  . GAD (generalized anxiety disorder)   . Depression   . Chronic back pain     injuries 1998, 2008, 01/2009  . Peripheral neuropathy   . Hypogonadism male     can't afford testosterone   History reviewed. No pertinent past surgical history. Prior to Admission medications   Medication Sig Start Date End Date Taking? Authorizing Provider  ALPRAZolam Prudy Feeler) 1 MG tablet Take 1 mg by mouth 3 (three) times daily as needed. For anxiety   Yes Historical Provider, MD  amitriptyline (ELAVIL) 75 MG tablet Take 75 mg by mouth at bedtime.   Yes Historical Provider, MD  amLODipine (NORVASC) 5 MG tablet Take 5 mg by mouth daily.   Yes Historical Provider, MD  aspirin EC 81 MG tablet Take 81 mg by mouth daily.   Yes Historical Provider, MD  B Complex-C (SUPER B COMPLEX PO) Take 1 capsule by mouth daily.   Yes Historical Provider, MD  clopidogrel (PLAVIX) 75 MG tablet Take 75 mg by mouth daily.   Yes Historical Provider, MD  fish oil-omega-3 fatty acids 1000 MG capsule Take 1 g by mouth daily.   Yes Historical Provider, MD  hydrochlorothiazide (MICROZIDE) 12.5 MG capsule Take 12.5 mg by mouth daily as needed. For fluid retention   Yes Historical Provider, MD  isosorbide mononitrate (IMDUR) 60 MG 24 hr tablet Take 30-60 mg by mouth 2 (two) times daily. 1 tab in the am, 0.5 half tab at  bedtime   Yes Historical Provider, MD  lisinopril (PRINIVIL,ZESTRIL) 20 MG tablet Take 20 mg by mouth daily.   Yes Historical Provider, MD  nebivolol (BYSTOLIC) 10 MG tablet Take 10 mg by mouth daily.   Yes Historical Provider, MD  nitroGLYCERIN (NITROSTAT) 0.4 MG SL tablet Place 1 tablet (0.4 mg total) under the tongue every 5 (five) minutes as needed. 12/01/11  Yes Collene Gobble, MD  oxyCODONE (OXYCONTIN) 20 MG 12 hr tablet Take 20 mg by mouth every 12 (twelve) hours.   Yes Historical Provider, MD  Oxycodone HCl 10 MG TABS Take 10 mg by mouth every 4 (four) hours.   Yes Historical Provider, MD  PARoxetine (PAXIL) 20 MG tablet Take 20 mg by mouth every morning.   Yes Historical Provider, MD  pregabalin (LYRICA) 75 MG capsule Take 75 mg by mouth 3 (three) times daily.   Yes Historical Provider, MD  zolpidem (AMBIEN) 10 MG tablet Take 10 mg by mouth at bedtime as needed. For insomnia 12/01/11  Yes Collene Gobble, MD   No Known Allergies  FAMILY HISTORY:  History reviewed. No pertinent family history. SOCIAL HISTORY:  reports that he has been smoking Cigarettes.  He has been smoking about 1 pack per day. He does not have any smokeless tobacco history on file. He reports that he drinks alcohol. He reports that he does not use illicit drugs.  REVIEW OF SYSTEMS:   Review of Systems  Constitutional: No weight loss, gain, night sweats, Fevers, chills, fatigue .  HEENT: No headaches, visual changes, Difficulty swallowing, Tooth/dental problems, or Sore throat,  No sneezing, itching, ear ache, nasal congestion, post nasal drip, no visual complaints CV: No chest pain, Orthopnea, PND, swelling in lower extremities, +dizziness,- palpitations, +syncope.  GI No heartburn, indigestion, abdominal pain, nausea, vomiting, diarrhea, change in bowel habits, loss of appetite, bloody stools.  Resp: No cough, No coughing up of blood. No change in color of mucus. No wheezing.  Skin: no rash or itching or icterus GU:  no dysuria, change in color of urine, no urgency or frequency. No flank pain, no hematuria  MS: No joint pain or swelling. No decreased range of motion  Psych: + anxiety and depression.  Neuro: no difficulty with speech, weakness, numbness, ataxia    Interval/ subjective:  Now in the intensive care.  Vital Signs: Temp:  [97.4 F (36.3 C)-98 F (36.7 C)] 97.6 F (36.4 C) (11/02 0746) Pulse Rate:  [53-74] 55  (11/02 0700) Resp:  [11-21] 13  (11/02 0700) BP: (60-123)/(38-73) 98/51 mmHg (11/02 0700) SpO2:  [93 %-100 %] 97 % (11/02 0700) Weight:  [97.8 kg (215 lb 9.8 oz)] 97.8 kg (215 lb 9.8 oz) (11/02 0357)  Physical Examination: General:  Awake, alert, no focal def,  no tremor  Neuro:  Oriented X 3 HEENT:  [place], no JVD Cardiovascular:  rrr Lungs:  clear Abdomen:  Soft, non-tender  Musculoskeletal:  intact Skin:  Intact   DIAGNOSES: Active Problems:  Depression  Chronic back pain  Hypotension  H/O ETOH abuse   ASSESSMENT / PLAN: PULMONARY No results found for this basename: PHART:3,PCO2ART:3,PO2ART:3,HCO3:3,O2SAT:3 in the last 168 hours  Ventilator Settings:   CXR:  None  A:  No acute issue  P:   Pulse ox Low threshold PCXR evaluation, re assurance volume status  CARDIOVASCULAR No results found for this basename: TROPONIN:3,LATICACIDVEN:3,O2SATVEN:3,PROBNP:3 in the last 168 hours ECG:  Maybe slight up enlarged T waves, no st changes, good rv wave progression  A: Orthostatic Hypotension Syncopal episode Has h/o CAD and htn. Was on several antihypertensives.  P:  Cont IVFs Hold antihypertensives. These will need to be readdressed.  Cont tele monitoring to be sure no arrhythmia ETOH, syncope, assess echo, r/o cardiomyopathy ACTH stim to eval per primary  RENAL  Lab 06/16/12 0500 06/15/12 1848 06/15/12 1742  NA 134* 135 136  K 4.1 4.6 4.6  CL 103 99 101  CO2 23 21 --  BUN 25* 32* 32*  CREATININE 1.19 2.22* 2.10*  CALCIUM 8.8 9.1 --  MG -- -- --  PHOS  -- -- --   Intake/Output      11/01 0701 - 11/02 0700 11/02 0701 - 11/03 0700   I.V. (mL/kg) 2000 (20.5)    Total Intake(mL/kg) 2000 (20.5)    Net +2000           A:   Acute renal failure: likely due to volume depletion, and meds Dehydration Improved w/ IVFs & holding antihypertensives.  P:   Cont IVFs Hold antihypertensives. These will need to be readdressed.  Push fluids   GASTROINTESTINAL  Lab 06/15/12 1848  AST 39*  ALT 56*  ALKPHOS 79  PROT 7.3  ALBUMIN 4.0    A: Abnormal LFTs.  etoh related.  P:   Cont supportive care F/u LFTs next 24-48h  HEMATOLOGIC  Lab 06/16/12 0500 06/15/12 1848 06/15/12 1742  HGB 14.1 14.9 15.0  HCT 39.7 42.2 44.0  PLT 201 221 --  INR -- -- --  APTT -- -- --    A:  dvt prevention P:  Cont [place] heparin   INFECTIOUS  Lab 06/16/12 0500 06/15/12 1848  PROCALCITON -- --  WBC 8.4 11.6*  LATICACIDVEN -- --    A:  No acute evidence of infection  P:   Trend fever curve and WBC trend   ENDOCRINE CBG (last 3)   Lab 06/16/12 0357  GLUCAP 112*    A:  Mild hyperglycemia P:   Could consider checking A1C. Will defer this to FPTS.   NEUROLOGIC  A:   H/o ETOH H/o anxiety H/o chronic pain He is high risk for substance w/d. Currently he is appropriate and cooperative. Agree that this does not sound like seizure, although he is a seizure risk. Agree w/ current plan of care including close observation P:   Cont CIWA protocol Cont thiamine and folate.  Agree he is safe for xfer to SDU setting.   Summary statement:  This is a 51 year old male w/ heavy ETOH history.  Admitted by the FPTS on 11/1 w/ orthostatic Hypotension & syncope. Has responded well to IVFs and holding antihypertensives. Suspect that syncopal episodes have been multifactorial: due to dehydration, antihypertensives and narcotics. His SBP and renal fxn have normalized w/  IVFs and holding antihypertensives. Have d/w the FPTS resident on call. Agree that primary  issue currently will be observation for delirium tremens and empiric treatment for this. As things stabilize attention will need to be turned to re-evaluation of his home medications, and ETOH cessation. Agree he is stable to transfer to SDU when bed available. PCCM available on PRN status.  Follow echo result  Mcarthur Rossetti. Tyson Alias, MD, FACP Pgr: [phone] [facility]  Pulmonary and Critical Care Medicine [facility] Pager: [phone]  06/16/2012, 8:35 AM

## 2012-06-16 NOTE — ED Notes (Signed)
Pt with increase in agitation son at bedside. Pt states "you are holding against my will." Explain plan of care to pt. Administer pain medications and explain wait time. Pt agreeable and calm.Marland Kitchen

## 2012-06-16 NOTE — Progress Notes (Signed)
eLink Physician-Brief Progress Note Patient Name: Leonard Murray DOB: [birth year] MRN: [id number]  Date of Service  06/16/2012   HPI/Events of Note   Stress ulcer prophylaxis   eICU Interventions  Protonix ordered      [facility] 06/16/2012, 4:10 PM

## 2012-06-16 NOTE — ED Notes (Signed)
Pt resting without complaint at this time. Ativan held due to pt's LOC. Pt arousable with tactile stimulation then falls back asleep. respirations easy non labored son at bedside.

## 2012-06-17 LAB — CBC
HCT: 41.2 % (ref 39.0–52.0)
Hemoglobin: 14.7 g/dL (ref 13.0–17.0)
MCHC: 35.7 g/dL (ref 30.0–36.0)
RDW: 12.2 % (ref 11.5–15.5)
WBC: 8.8 10*3/uL (ref 4.0–10.5)

## 2012-06-17 LAB — COMPREHENSIVE METABOLIC PANEL
BUN: 10 mg/dL (ref 6–23)
CO2: 23 mEq/L (ref 19–32)
Calcium: 8.8 mg/dL (ref 8.4–10.5)
GFR calc Af Amer: >90 mL/min (ref >90)
GFR calc non Af Amer: >90 mL/min (ref >90)
Glucose, Bld: 120 mg/dL — ABNORMAL HIGH (ref 70–99)
Total Protein: 6.6 g/dL (ref 6.0–8.3)

## 2012-06-17 LAB — ACTH STIMULATION, 3 TIME POINTS: Cortisol, 60 Min: 34.1 ug/dL (ref >20)

## 2012-06-17 MED ORDER — LORAZEPAM 2 MG/ML IJ SOLN: 1.0000 mg | Freq: Four times a day (QID) | INTRAMUSCULAR | Status: DC

## 2012-06-17 MED ORDER — ISOSORBIDE MONONITRATE ER 60 MG PO TB24: 30.0000 mg | ORAL_TABLET | Freq: Every morning | ORAL | Status: DC

## 2012-06-17 MED ORDER — LISINOPRIL 20 MG PO TABS: 10.0000 mg | ORAL_TABLET | Freq: Every day | ORAL | Status: DC

## 2012-06-17 NOTE — Progress Notes (Signed)
  Echocardiogram 2D Echocardiogram has been performed.  Leonard Murray 06/17/2012, 10:15 AM

## 2012-06-17 NOTE — Progress Notes (Signed)
Orthostatic BP as follow, laying BP 146/78, 72,23,99% on RA, Sitting, 112/81,80,24,99%, Standing, 95/63,81,15, 99%.

## 2012-06-17 NOTE — Discharge Summary (Signed)
Physician Discharge Summary  Patient ID: ONIX JUMPER MRN: [id number] DOB/AGE: [birth year] 53 y.o.  Admit date: 06/15/2012 Discharge date: 06/17/2012  Admission Diagnoses: Syncope  Discharge Diagnoses:  Active Problems:  Depression  Chronic back pain  Hypotension  H/O ETOH abuse   Discharged Condition: stable  Hospital Course:  51 yo male with h/o CAD with stent, HTN, HLD, chronic pain, generalized anxiety disorder and alcohol abuse who presented with worsening syncopal episodes, hypotension and alcohol/benzo withdrawal symptoms.   1. Orthostatic hypotension with syncope: Thought to secondary to decreased PO intake, medications, and possible autonomic dysfunction from alcohol abuse.  He was markedly hypotension on admission.  He responded well to IV fluids.  Cardiac work up was negative with no events on telemetry and negative troponins x3.  Head CT on admission was negative for intracranial processes, MRI c-spine negative for cord signal changes.  An ACTH stimulation test was normal.  On day of discharge, he reported feeling much better.  Orthostatics were still positive with systolic dropping for 140s to 40J, but he was asymptomatic.  His blood pressure medications were decreased and he was discharge on lisinopril 10mg  daily and Imdur 30mg  daily only.  Also encouraged to take Xanax only as prescribed and to take as little narcotic as possible.    2. Alcohol and benzo withdrawal: He was started on scheduled ativan due to concern for benzo withdrawal.  He was also monitored for alcohol withdrawal with the CIWA protocol.   3. Acute Renal Injury: Likely secondary to hypotension.  Improved back to baseline with IVFs.     Consults: pulmonary/intensive care  Significant Diagnostic Studies: labs: troponins neg x3, ACTH stim test normal; and radiology: MRI: c-spine negative for myelopathy  Treatments: IV hydration and analgesia: home oxycodone/oxycontin  Discharge Exam: See progress note  from day of discharge.  Disposition: home  Discharge Orders    Future Orders Please Complete By Expires   Diet - low sodium heart healthy      Increase activity slowly      Call MD for:  persistant dizziness or light-headedness          Medication List     As of 06/17/2012  2:08 PM    STOP taking these medications         amLODipine 5 MG tablet   Commonly known as: NORVASC      hydrochlorothiazide 12.5 MG capsule   Commonly known as: MICROZIDE      nebivolol 10 MG tablet   Commonly known as: BYSTOLIC      TAKE these medications         ALPRAZolam 1 MG tablet   Commonly known as: XANAX   Take 1 mg by mouth 3 (three) times daily as needed. For anxiety      amitriptyline 75 MG tablet   Commonly known as: ELAVIL   Take 75 mg by mouth at bedtime.      aspirin EC 81 MG tablet   Take 81 mg by mouth daily.      clopidogrel 75 MG tablet   Commonly known as: PLAVIX   Take 75 mg by mouth daily.      fish oil-omega-3 fatty acids 1000 MG capsule   Take 1 g by mouth daily.      isosorbide mononitrate 60 MG 24 hr tablet   Commonly known as: IMDUR   Take 0.5 tablets (30 mg total) by mouth every morning.      lisinopril 20 MG tablet  Commonly known as: PRINIVIL,ZESTRIL   Take 0.5 tablets (10 mg total) by mouth daily.      nitroGLYCERIN 0.4 MG SL tablet   Commonly known as: NITROSTAT   Place 1 tablet (0.4 mg total) under the tongue every 5 (five) minutes as needed.      Oxycodone HCl 10 MG Tabs   Take 10 mg by mouth every 4 (four) hours.      oxyCODONE 20 MG 12 hr tablet   Commonly known as: OXYCONTIN   Take 20 mg by mouth every 12 (twelve) hours.      PARoxetine 20 MG tablet   Commonly known as: PAXIL   Take 20 mg by mouth every morning.      pregabalin 75 MG capsule   Commonly known as: LYRICA   Take 75 mg by mouth 3 (three) times daily.      SUPER B COMPLEX PO   Take 1 capsule by mouth daily.      zolpidem 10 MG tablet   Commonly known as: AMBIEN    Take 10 mg by mouth at bedtime as needed. For insomnia           Follow-up Information    Schedule an appointment as soon as possible for a visit with Lucilla Edin, MD.   Contact information:   [address] [place] [address] [phone]          Signed: BOOTH, Denny Peon 06/17/2012, 2:08 PM

## 2012-06-17 NOTE — Progress Notes (Signed)
Patient being discharged home per MD order. All discharge instructions given and patient voiced understanding. Patients vital signs WNL, and A&O.

## 2012-06-17 NOTE — Progress Notes (Signed)
FMTS Attending Daily Note: Leonard Levy MD [phone] pager office [phone] I  have seen and examined this patient, reviewed their chart. I have discussed this patient with the resident. I agree with the resident's findings, assessment and care plan. Negative for endocrinopathy, stable neck pathology (former ACDF), no abnormal arrhythmias, no sign of acute cardiac event.BP stable---he wants to go home and I think that is safe ---will dend on decreased Htn regimen w f/u Dr Cleta Alberts this week.

## 2012-06-17 NOTE — Progress Notes (Signed)
FMTS Daily Intern Progress Note  Subjective:  Sleeping.  Spoke with girlfriend who stated that he had a restless night and that sleeping late in the morning is normal for him.  Girlfriend also states that he is starting to want to go home.  I have reviewed the patient's medications.  Objective Temp:  [97.6 F (36.4 C)-98.4 F (36.9 C)] 98.4 F (36.9 C) (11/03 0800) Pulse Rate:  [58-76] 76  (11/03 0800) Resp:  [13-18] 16  (11/03 0449) BP: (75-119)/(41-70) 106/51 mmHg (11/03 0449) SpO2:  [98 %-100 %] 99 % (11/03 0800)   Intake/Output Summary (Last 24 hours) at 06/17/12 0928 Last data filed at 06/17/12 0800  Gross per 24 hour  Intake   2615 ml  Output   1800 ml  Net    815 ml    CBG (last 3)   Basename 06/16/12 0357  GLUCAP 112*    General: sleeping; arousable to voice, but falls asleep quickly CV: S1S2, rrr, no murmur appreciated Pulm: cta b/l, transmitted upper airway noise Abd: present bowel sounds, distended, non tender Ext: no edema Neuro: sleeping  Labs and Imaging  Lab 06/17/12 0455 06/16/12 0500 06/15/12 1848  WBC 8.8 8.4 11.6*  HGB 14.7 14.1 14.9  HCT 41.2 39.7 42.2  PLT 205 201 221     Lab 06/17/12 0455 06/16/12 0500 06/15/12 1848  NA 135 134* 135  K 3.8 4.1 4.6  CL 103 103 99  CO2 23 23 21   BUN 10 25* 32*  CREATININE 0.81 1.19 2.22*  LABGLOM -- -- --  GLUCOSE 120* -- --  CALCIUM 8.8 8.8 9.1    Assessment and Plan 51 yo male with h/o CAD with stent, HTN, HLD, chronic pain, generalized anxiety disorder and alcohol abuse who presented with worsening syncopal episodes, hypotension and alcohol/benzo withdrawal symptoms.   1. Hypotension: likely multifactorial with combination of poor po, multiple HTN medicines and opiate use. Responded to IV fluids although still low. No evidence of ischemic demand from hypotension given normal troponin x3.  - continue to monitor: appears to be at baseline BP - continue with IVF - CCM aware of patient - holding  nebivolol, lisinopril, hctz, norvasc, imdur for now  2. Alcohol and benzo withdrawal: last drink over 72 hours ago with known heavy chronic alcohol abuse and benzo use. Patient appeared agitated suggesting a component of withdrawal although bradycardia and hypotension do not entirely support withdrawal.  - start ativan 1mg  every 4 hours scheduled  - ciwa protocol for monitoring  - banana bag at 125 cc/hr followed by D51/2NS at 125cc/hr  3. Syncopal episodes: given history, appear related to orthostatic hypotension. Unlikely seizure activity given such short duration in nature and atypical presentation. Head CT on admission was negative for intracranial process. MRI c-spine negative for cord signal changes. - treat hypotension  - continue to monitor neuro status and consider neuro consult if persistent symptoms once hypotension resolved  - PT/OT   4. Acute renal failure: RESOLVED.  from creatinine in March 2013 of 0.8. Renal function improving since yesterday from 2.2 to 1.19 after IV hydration. Normal CK making rhabdo unlikely. Making urine.  - likely prerenal but will follow up on FeNa to rule out ATN  - monitor urine output  - IV hydration   5. H/o chronic pain: on chronic opiates at home  - restart home lyrica  - start back home oxycodone.  - on oxycontin 10mg  daily (down from 20mg  BID)   6. Elevated LFT's: RESOLVED.  mildly elevated in context of chronic alcohol use.    7. H/o sleep apnea: not compliant with CPAP at home  - consider CPAP while in hospital   8. H/o CAD: troponins negative x3. - Hold antihypertensive meds for now.  -Continue plavix and asa.  Fen/Gi: cardiac diet, D5 1/2NS 125cc/hr PPx: Heparin  Dispo: Step down. D/c pending stable BP and echo.  Will stop by and check on patient this afternoon.  Despina Hick Pager: [phone] 06/17/2012, 9:28 AM

## 2012-06-18 NOTE — Progress Notes (Signed)
Utilization review completed- retro 

## 2012-06-18 NOTE — Discharge Summary (Signed)
Family Medicine Teaching Service  Discharge Note : Attending Alexes Menchaca MD Pager [phone] Office [phone] I have seen and examined this patient, reviewed their chart and discussed discharge planning wit the resident at the time of discharge. I agree with the discharge plan as above.  

## 2012-06-23 LAB — CULTURE, BLOOD (ROUTINE X 2)

## 2012-06-24 LAB — CULTURE, BLOOD (ROUTINE X 2): Culture: NO GROWTH

## 2012-07-07 ENCOUNTER — Other Ambulatory Visit: Payer: Self-pay | Admitting: Emergency Medicine

## 2012-07-28 ENCOUNTER — Other Ambulatory Visit: Payer: Self-pay | Admitting: Emergency Medicine

## 2012-07-29 NOTE — Telephone Encounter (Signed)
Please call patient and let him know I need clearance from his pain management doctor that it is okay for [place] to prescribe his Ambien for sleep

## 2012-07-30 NOTE — Telephone Encounter (Signed)
Spoke with patient and advised Dr. Cleta Alberts would need clearance from patients pain management doctor before he could ok the Ambien.  Patient states he see's them on 12/23 and will bring in a note.

## 2012-09-21 ENCOUNTER — Encounter (HOSPITAL_COMMUNITY): Payer: Self-pay | Admitting: Emergency Medicine

## 2012-09-21 ENCOUNTER — Emergency Department (HOSPITAL_COMMUNITY)
Admission: EM | Admit: 2012-09-21 | Discharge: 2012-09-21 | Disposition: A | Discharge status: Home / Self Care | Payer: Self-pay | Attending: Emergency Medicine | Admitting: Emergency Medicine

## 2012-09-21 DIAGNOSIS — G629 Polyneuropathy, unspecified: Secondary | ICD-10-CM

## 2012-09-21 DIAGNOSIS — R209 Unspecified disturbances of skin sensation: Secondary | ICD-10-CM | POA: Insufficient documentation

## 2012-09-21 DIAGNOSIS — I2510 Atherosclerotic heart disease of native coronary artery without angina pectoris: Secondary | ICD-10-CM | POA: Insufficient documentation

## 2012-09-21 DIAGNOSIS — F411 Generalized anxiety disorder: Secondary | ICD-10-CM | POA: Insufficient documentation

## 2012-09-21 DIAGNOSIS — Z862 Personal history of diseases of the blood and blood-forming organs and certain disorders involving the immune mechanism: Secondary | ICD-10-CM | POA: Insufficient documentation

## 2012-09-21 DIAGNOSIS — G609 Hereditary and idiopathic neuropathy, unspecified: Secondary | ICD-10-CM | POA: Insufficient documentation

## 2012-09-21 DIAGNOSIS — I10 Essential (primary) hypertension: Secondary | ICD-10-CM | POA: Insufficient documentation

## 2012-09-21 DIAGNOSIS — Z79899 Other long term (current) drug therapy: Secondary | ICD-10-CM | POA: Insufficient documentation

## 2012-09-21 DIAGNOSIS — E785 Hyperlipidemia, unspecified: Secondary | ICD-10-CM | POA: Insufficient documentation

## 2012-09-21 DIAGNOSIS — G4733 Obstructive sleep apnea (adult) (pediatric): Secondary | ICD-10-CM | POA: Insufficient documentation

## 2012-09-21 DIAGNOSIS — Z7902 Long term (current) use of antithrombotics/antiplatelets: Secondary | ICD-10-CM | POA: Insufficient documentation

## 2012-09-21 DIAGNOSIS — Z8639 Personal history of other endocrine, nutritional and metabolic disease: Secondary | ICD-10-CM | POA: Insufficient documentation

## 2012-09-21 DIAGNOSIS — F17200 Nicotine dependence, unspecified, uncomplicated: Secondary | ICD-10-CM | POA: Insufficient documentation

## 2012-09-21 MED ORDER — FUROSEMIDE 20 MG PO TABS: 20.0000 mg | ORAL_TABLET | Freq: Two times a day (BID) | ORAL | Status: DC

## 2012-09-21 NOTE — ED Notes (Signed)
Pt c/o foot pain and numbness x years and pt sts thinks he has problems with blood flow and not getting better
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
## 2012-09-21 NOTE — ED Notes (Signed)
Pt discharged to home with family. NAD.  

## 2012-09-25 ENCOUNTER — Emergency Department (HOSPITAL_COMMUNITY)
Admission: EM | Admit: 2012-09-25 | Discharge: 2012-09-25 | Disposition: A | Discharge status: Home / Self Care | Payer: No Typology Code available for payment source | Source: Home / Self Care | Attending: Family Medicine | Admitting: Family Medicine

## 2012-09-25 ENCOUNTER — Encounter (HOSPITAL_COMMUNITY): Payer: Self-pay

## 2012-09-25 DIAGNOSIS — F1011 Alcohol abuse, in remission: Secondary | ICD-10-CM

## 2012-09-25 DIAGNOSIS — I10 Essential (primary) hypertension: Secondary | ICD-10-CM

## 2012-09-25 DIAGNOSIS — G589 Mononeuropathy, unspecified: Secondary | ICD-10-CM

## 2012-09-25 DIAGNOSIS — G4733 Obstructive sleep apnea (adult) (pediatric): Secondary | ICD-10-CM

## 2012-09-25 DIAGNOSIS — E785 Hyperlipidemia, unspecified: Secondary | ICD-10-CM

## 2012-09-25 DIAGNOSIS — G629 Polyneuropathy, unspecified: Secondary | ICD-10-CM

## 2012-09-25 DIAGNOSIS — I2510 Atherosclerotic heart disease of native coronary artery without angina pectoris: Secondary | ICD-10-CM

## 2012-09-25 LAB — HEMOGLOBIN A1C
Hgb A1c MFr Bld: 5.5 % (ref <5.7)
Mean Plasma Glucose: 111 mg/dL (ref <117)

## 2012-09-25 LAB — COMPREHENSIVE METABOLIC PANEL
AST: 33 U/L (ref 0–37)
BUN: 9 mg/dL (ref 6–23)
CO2: 24 mEq/L (ref 19–32)
Calcium: 9.5 mg/dL (ref 8.4–10.5)
Chloride: 99 mEq/L (ref 96–112)
Creatinine, Ser: 0.59 mg/dL (ref 0.50–1.35)
GFR calc Af Amer: >90 mL/min (ref >90)
GFR calc non Af Amer: >90 mL/min (ref >90)
Glucose, Bld: 101 mg/dL — ABNORMAL HIGH (ref 70–99)
Total Bilirubin: 0.6 mg/dL (ref 0.3–1.2)

## 2012-09-25 LAB — LIPID PANEL: Cholesterol: 168 mg/dL (ref 0–200)

## 2012-09-25 LAB — VITAMIN B12: Vitamin B-12: 773 pg/mL (ref 211–911)

## 2012-09-25 MED ORDER — LISINOPRIL 20 MG PO TABS: 20.0000 mg | ORAL_TABLET | Freq: Every day | ORAL | Status: DC

## 2012-09-25 MED ORDER — NEBIVOLOL HCL 10 MG PO TABS: 10.0000 mg | ORAL_TABLET | Freq: Every day | ORAL | Status: DC

## 2012-09-25 MED ORDER — INFLUENZA VIRUS VACC SPLIT PF IM SUSP
0.5000 mL | Freq: Once | INTRAMUSCULAR | Status: AC
  Administered 2012-09-25: 0.5 mL via INTRAMUSCULAR

## 2012-09-25 NOTE — ED Notes (Signed)
Patient was recently in the ED for leg pain- DX- neuropathy
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
## 2012-09-26 ENCOUNTER — Emergency Department (INDEPENDENT_AMBULATORY_CARE_PROVIDER_SITE_OTHER)
Admission: EM | Admit: 2012-09-26 | Discharge: 2012-09-26 | Disposition: A | Discharge status: Home / Self Care | Payer: No Typology Code available for payment source | Source: Home / Self Care

## 2012-09-26 DIAGNOSIS — G589 Mononeuropathy, unspecified: Secondary | ICD-10-CM

## 2012-09-26 DIAGNOSIS — F1011 Alcohol abuse, in remission: Secondary | ICD-10-CM

## 2012-09-26 LAB — CBC
HCT: 46 % (ref 39.0–52.0)
Hemoglobin: 16.6 g/dL (ref 13.0–17.0)
MCHC: 36.1 g/dL — ABNORMAL HIGH (ref 30.0–36.0)
RBC: 5.05 MIL/uL (ref 4.22–5.81)
WBC: 9.4 10*3/uL (ref 4.0–10.5)

## 2012-09-26 LAB — VITAMIN D 25 HYDROXY (VIT D DEFICIENCY, FRACTURES): Vit D, 25-Hydroxy: <10 ng/mL — ABNORMAL LOW (ref 30–89)

## 2012-09-26 NOTE — ED Notes (Signed)
Patient here for repeat cbc- tube had clotted before it reached lab

## 2012-09-28 NOTE — Progress Notes (Signed)
Quick Note:  Please inform patient that his labs came back OK except that his Vitamin D level came back very low. He needs to take some extra vitamin D to raise his levels by prescription for a few weeks, then take OTC vitamin D 1000 IU po daily. Please call in Drisdol 50,000 IU caps, take 1 cap po twice per week, #16, No RF. We should recheck his Vit D levels in 3 months.   Rodney Langton, MD, CDE, FAAFP [facility] [place], [place]   ______

## 2012-10-01 ENCOUNTER — Telehealth (HOSPITAL_COMMUNITY): Payer: Self-pay

## 2012-10-01 NOTE — Telephone Encounter (Signed)
Message copied by Lestine Mount on Mon Oct 01, 2012  9:47 AM ------      Message from: Cleora Fleet      Created: Fri Sep 28, 2012  9:15 AM       Please inform patient that his labs came back OK except that his Vitamin D level came back very low.  He needs to take some extra vitamin D to raise his levels by prescription for a few weeks, then take OTC vitamin D 1000 IU po daily.   Please call in Drisdol 50,000 IU caps, take 1 cap po twice per week, #16, No RF.  We should recheck his Vit D levels in 3 months.              Rodney Langton, MD, CDE, FAAFP      [facility]      [facility]      [place], [place]        ------

## 2012-10-03 NOTE — ED Notes (Signed)
Referral sent to [facility] for chronic pain waiting for an appt.

## 2012-10-03 NOTE — ED Notes (Signed)
Referral faxed to neurology for neuropathy appt for 4/17 @ 12:[address] [facility] [place]

## 2012-10-09 ENCOUNTER — Telehealth (HOSPITAL_COMMUNITY): Payer: Self-pay

## 2012-10-10 ENCOUNTER — Other Ambulatory Visit: Payer: Self-pay | Admitting: Physician Assistant

## 2012-10-10 ENCOUNTER — Other Ambulatory Visit: Payer: Self-pay | Admitting: Emergency Medicine

## 2012-10-10 NOTE — Telephone Encounter (Signed)
Please advise on refill of elavil 75 mg to [facility] pharmacy

## 2012-11-06 ENCOUNTER — Emergency Department (HOSPITAL_COMMUNITY)
Admission: EM | Admit: 2012-11-06 | Discharge: 2012-11-06 | Disposition: A | Discharge status: Home / Self Care | Payer: No Typology Code available for payment source | Source: Home / Self Care | Attending: Family Medicine | Admitting: Family Medicine

## 2012-11-06 ENCOUNTER — Encounter (HOSPITAL_COMMUNITY): Payer: Self-pay

## 2012-11-06 DIAGNOSIS — I2510 Atherosclerotic heart disease of native coronary artery without angina pectoris: Secondary | ICD-10-CM

## 2012-11-06 DIAGNOSIS — E785 Hyperlipidemia, unspecified: Secondary | ICD-10-CM

## 2012-11-06 DIAGNOSIS — R7303 Prediabetes: Secondary | ICD-10-CM | POA: Insufficient documentation

## 2012-11-06 DIAGNOSIS — G8929 Other chronic pain: Secondary | ICD-10-CM

## 2012-11-06 DIAGNOSIS — G629 Polyneuropathy, unspecified: Secondary | ICD-10-CM

## 2012-11-06 DIAGNOSIS — G609 Hereditary and idiopathic neuropathy, unspecified: Secondary | ICD-10-CM

## 2012-11-06 DIAGNOSIS — M542 Cervicalgia: Secondary | ICD-10-CM | POA: Insufficient documentation

## 2012-11-06 MED ORDER — AMITRIPTYLINE HCL 100 MG PO TABS: 75.0000 mg | ORAL_TABLET | Freq: Every day | ORAL | Status: DC

## 2012-11-06 MED ORDER — PAROXETINE HCL 10 MG PO TABS: 10.0000 mg | ORAL_TABLET | Freq: Every day | ORAL | Status: DC

## 2012-11-06 NOTE — ED Provider Notes (Signed)
History    CSN: [id number]  Arrival date & time 11/06/12  1512   First MD Initiated Contact with Patient 11/06/12 1538     Chief Complaint  Patient presents with  . Medication Refill   HPI Pt says that he is out of her medications for depression.  He is taking elavil at night and paxil during the day and this has been helping but he has been out of the meds for about a month.  His pain management doctor told him that he is going to start lyrica and reports that he could benefit from maximizing elavil because it can help his severe neuropathic pain in feet and legs.  Pt wants to get back on this.  He also says that he can no longer work because of his disability and says that he cannot operate any machinery because of the chronic medications that he takes.  He is applying for disability benefits.   Past Medical History  Diagnosis Date  . CAD (coronary artery disease) 12/2007    AMI with stenting; retinal embolus 08/2005; stenting 03/2009  . Hyperlipidemia   . Hypertension   . OSA (obstructive sleep apnea)     not using CPAP-feels suffocating  . GAD (generalized anxiety disorder)   . Depression   . Chronic back pain     injuries 1998, 2008, 01/2009  . Peripheral neuropathy   . Hypogonadism male     can't afford testosterone    History reviewed. No pertinent past surgical history.  No family history on file.  History  Substance Use Topics  . Smoking status: Current Every Day Smoker -- 1.00 packs/day    Types: Cigarettes  . Smokeless tobacco: Not on file  . Alcohol Use: Yes    Review of Systems  Constitutional: Positive for fatigue.  HENT: Positive for congestion, rhinorrhea and postnasal drip.   Eyes: Negative.   Respiratory: Negative.   Cardiovascular: Negative.   Gastrointestinal: Negative.   Endocrine: Negative.   Musculoskeletal: Positive for myalgias, back pain and arthralgias.  Skin: Negative.   Neurological: Positive for weakness and numbness.  Hematological:  Negative.   Psychiatric/Behavioral: Negative.   All other systems reviewed and are negative.    Allergies  Review of patient's allergies indicates no known allergies.  Home Medications   Current Outpatient Rx  Name  Route  Sig  Dispense  Refill  . ALPRAZolam (XANAX) 1 MG tablet   Oral   Take 1 mg by mouth 3 (three) times daily as needed. For anxiety         . amitriptyline (ELAVIL) 75 MG tablet   Oral   Take 75 mg by mouth at bedtime.         Marland Kitchen aspirin EC 81 MG tablet   Oral   Take 81 mg by mouth daily.         . B Complex-C (SUPER B COMPLEX PO)   Oral   Take 1 capsule by mouth daily.         . clopidogrel (PLAVIX) 75 MG tablet   Oral   Take 75 mg by mouth daily.         . fish oil-omega-3 fatty acids 1000 MG capsule   Oral   Take 1 g by mouth daily.         . isosorbide mononitrate (IMDUR) 60 MG 24 hr tablet   Oral   Take 0.5 tablets (30 mg total) by mouth every morning.   30 tablet  0   . lisinopril (PRINIVIL,ZESTRIL) 20 MG tablet   Oral   Take 1 tablet (20 mg total) by mouth daily.   30 tablet   0   . nebivolol (BYSTOLIC) 10 MG tablet   Oral   Take 1 tablet (10 mg total) by mouth daily.   30 tablet   0   . nitroGLYCERIN (NITROSTAT) 0.4 MG SL tablet   Sublingual   Place 1 tablet (0.4 mg total) under the tongue every 5 (five) minutes as needed.   30 tablet   1   . oxyCODONE (OXYCONTIN) 20 MG 12 hr tablet   Oral   Take 20 mg by mouth every 12 (twelve) hours.         Marland Kitchen oxyCODONE (ROXICODONE) 15 MG immediate release tablet   Oral   Take 15 mg by mouth every 4 (four) hours.         Marland Kitchen PARoxetine (PAXIL) 20 MG tablet      TAKE ONE (1) TABLET BY MOUTH EVERY DAY   30 tablet   0   . pregabalin (LYRICA) 75 MG capsule   Oral   Take 75 mg by mouth 3 (three) times daily.         Marland Kitchen zolpidem (AMBIEN) 10 MG tablet   Oral   Take 10 mg by mouth at bedtime as needed. For insomnia           BP 143/91  Pulse 90  Temp(Src) 97.4 F  (36.3 C) (Oral)  SpO2 95%  Physical Exam  Nursing note and vitals reviewed. Constitutional: He is oriented to person, place, and time. He appears well-developed and well-nourished. No distress.  HENT:  Head: Normocephalic and atraumatic.  Eyes: Conjunctivae and EOM are normal. Pupils are equal, round, and reactive to light. Right eye exhibits no discharge. Left eye exhibits no discharge. No scleral icterus.  Neck: Normal range of motion. Neck supple. No JVD present. No tracheal deviation present. No thyromegaly present.  Cardiovascular: Normal rate, regular rhythm and normal heart sounds.   Pulmonary/Chest: Effort normal and breath sounds normal. No respiratory distress. He has no wheezes. He exhibits no tenderness.  Abdominal: Soft. Bowel sounds are normal. There is no tenderness.  Musculoskeletal: He exhibits tenderness. He exhibits no edema.  Lymphadenopathy:    He has no cervical adenopathy.  Neurological: He is alert and oriented to person, place, and time.  Skin: Skin is warm and dry. No erythema. No pallor.  Psychiatric: He has a normal mood and affect. His behavior is normal. Judgment and thought content normal.    ED Course  Procedures (including critical care time)  Labs Reviewed - No data to display No results found.  No diagnosis found.  MDM  IMPRESSION  Chronic debilitating neuropathic pain   Cervicalgia  Brachial plexus lesion  Lumbar sprain  Muscle spasm  Peripheral polyneuropathy  Prediabetes  Depression / anxiety disorder    RECOMMENDATIONS / PLAN  REfill elavil 100 mg po at HS Paxil 20 mg po daily   FOLLOW UP  The patient was given clear instructions to go to ER or return to medical center if symptoms don't improve, worsen or new problems develop.  The patient verbalized understanding.  The patient was told to call to get lab results if they haven't heard anything in the next week.            Cleora Fleet, MD 11/06/12 2127

## 2012-11-06 NOTE — ED Notes (Signed)
Patient here for medication refill.

## 2012-11-08 MED ORDER — PAROXETINE HCL 10 MG PO TABS: 20.0000 mg | ORAL_TABLET | Freq: Every day | ORAL | Status: DC

## 2012-11-08 MED ORDER — CLOPIDOGREL BISULFATE 75 MG PO TABS: 75.0000 mg | ORAL_TABLET | Freq: Every day | ORAL | Status: DC

## 2012-12-14 ENCOUNTER — Other Ambulatory Visit: Payer: Self-pay | Admitting: Emergency Medicine

## 2012-12-14 ENCOUNTER — Other Ambulatory Visit: Payer: Self-pay | Admitting: Physician Assistant

## 2012-12-14 ENCOUNTER — Telehealth: Payer: Self-pay

## 2012-12-14 NOTE — Telephone Encounter (Signed)
Needs appt

## 2012-12-14 NOTE — Telephone Encounter (Signed)
Received RF request from [facility] for Paroxetine 20 mg QD. I see in EPIC that a Dr [facility] Rxs paroxetine 10 mg, 2 tabs QD but w/only #30 in March with 2 RFS. Called [facility] and they do not have any record of the Rx from Dr [facility]. [facility] has RFd last two RFs in Jan and Feb 2014.  LMOM for pt to call to clarify what is his correct dose and if Dr [facility] is now managing this? I do not see a record of who was writing this for pt from [facility] in EPIC.

## 2012-12-18 MED ORDER — PAROXETINE HCL 20 MG PO TABS: 20.0000 mg | ORAL_TABLET | ORAL | Status: DC

## 2012-12-18 NOTE — Telephone Encounter (Signed)
Tried to call pt and still can not get him. Received another request for RF from [facility]. I will give 1 mos RF from the pharmacy's records w/note to come in for OV.

## 2012-12-18 NOTE — Telephone Encounter (Signed)
Pt notified that he has to come in for ov before he run out of this refill.

## 2013-01-01 ENCOUNTER — Encounter: Payer: Self-pay | Admitting: Emergency Medicine

## 2013-01-02 ENCOUNTER — Telehealth: Payer: Self-pay | Admitting: General Practice

## 2013-01-02 MED ORDER — PREGABALIN 75 MG PO CAPS: 75.0000 mg | ORAL_CAPSULE | Freq: Three times a day (TID) | ORAL | Status: DC

## 2013-01-02 NOTE — Addendum Note (Signed)
Addended by: Alison Murray on: 01/02/2013 12:51 PM   Modules accepted: Orders

## 2013-01-02 NOTE — Telephone Encounter (Signed)
Patient is requesting script for lyrica Please review

## 2013-01-03 ENCOUNTER — Telehealth: Payer: Self-pay | Admitting: *Deleted

## 2013-01-04 ENCOUNTER — Encounter: Payer: Self-pay | Admitting: Cardiovascular Disease

## 2013-01-04 ENCOUNTER — Ambulatory Visit (INDEPENDENT_AMBULATORY_CARE_PROVIDER_SITE_OTHER): Payer: No Typology Code available for payment source | Admitting: Cardiovascular Disease

## 2013-01-04 ENCOUNTER — Ambulatory Visit: Payer: Medicaid Other | Attending: Family Medicine | Admitting: Family Medicine

## 2013-01-04 ENCOUNTER — Encounter: Payer: Self-pay | Admitting: Family Medicine

## 2013-01-04 VITALS — BP 133/80 | HR 92 | Temp 97.8°F | Resp 16 | Ht 70.0 in | Wt 221.0 lb

## 2013-01-04 VITALS — BP 120/76 | HR 79 | Ht 70.0 in | Wt 221.0 lb

## 2013-01-04 DIAGNOSIS — I2510 Atherosclerotic heart disease of native coronary artery without angina pectoris: Secondary | ICD-10-CM

## 2013-01-04 DIAGNOSIS — G629 Polyneuropathy, unspecified: Secondary | ICD-10-CM

## 2013-01-04 DIAGNOSIS — G8929 Other chronic pain: Secondary | ICD-10-CM

## 2013-01-04 DIAGNOSIS — G4700 Insomnia, unspecified: Secondary | ICD-10-CM

## 2013-01-04 DIAGNOSIS — E785 Hyperlipidemia, unspecified: Secondary | ICD-10-CM

## 2013-01-04 DIAGNOSIS — I10 Essential (primary) hypertension: Secondary | ICD-10-CM

## 2013-01-04 DIAGNOSIS — G609 Hereditary and idiopathic neuropathy, unspecified: Secondary | ICD-10-CM

## 2013-01-04 DIAGNOSIS — F411 Generalized anxiety disorder: Secondary | ICD-10-CM

## 2013-01-04 DIAGNOSIS — K029 Dental caries, unspecified: Secondary | ICD-10-CM

## 2013-01-04 MED ORDER — NEBIVOLOL HCL 10 MG PO TABS: 5.0000 mg | ORAL_TABLET | Freq: Every day | ORAL | Status: DC

## 2013-01-04 MED ORDER — HYDROCHLOROTHIAZIDE 12.5 MG PO CAPS: 12.5000 mg | ORAL_CAPSULE | Freq: Every day | ORAL | Status: DC

## 2013-01-04 NOTE — Patient Instructions (Signed)
Your physician has recommended you make the following change in your medication: Resume taking your Bystolic 5mg .  Your physician recommends that you schedule a follow-up appointment in: 6 months.

## 2013-01-04 NOTE — Patient Instructions (Addendum)
Coronary Artery Disease Coronary artery disease (CAD) is a process in which the heart (coronary) arteries narrow or become blocked from the development of atherosclerosis. Atherosclerosis is a disease in which plaque builds up on the inside of the heart arteries (coronary arteries). Plaque is made up of fats (lipids), cholesterol, calcium, and fibrous tissue. CAD can lead to a heart attack (myocardial infarction, MI). An MI can lead to heart failure, cardiogenic shock, or sudden cardiac death. CAD can cause an MI through:  Plaque buildup that can severely narrow or block the coronary arteries and diminish blood flow.  Plaque that can become unstable and "rupture." Unstable plaque that ruptures within a coronary artery can form a clot and cause a sudden (acute) blockage. RISK FACTORS Many risk factors contribute to the development of CAD. These include:  High cholesterol (dyslipidemia) levels.  High blood pressure (hypertension).  Smoking.  Diabetes.  Age.  Gender. Men can develop CAD earlier in life than women.  Family history.  Inactivity or lack of regular physical or aerobic exercise.  A diet high in saturated fats.  Chronic kidney disease. SYMPTOMS  When a coronary artery is narrowed or blocked, an MI can occur. MI symptoms can include:  Chest pain (agina). Angina can occur by itself or it can also occur with pain in the neck, arm, jaw, or in the upper, middle back (mid-scapular pain).  Profuse sweating (diaphoresis) without physical activity or movement.  Shortness of breath (dyspnea).  Irregular heartbeats (palpitations) that feel like your heart is skipping beats or is beating very fast.  Nausea.  Epigastric pain. Epigastric pain may occur as "heartburn."  Tiredness (malaise). This can especially be present in the elderly. Women can have different (atypical) symptoms other than classic angina.  DIAGNOSIS  The diagnosis of CAD may include:  An electrocardiography  (ECG). An ECG does not diagnose CAD, but it is usefull in the detection of a sudden (acute) MI or as a marker for a previous MI. Depending on which heart (coronary) artery may be blocked, an ECG may not pick up an MI pattern.  Exercise stress test. A stress test can be performed at rest for people who are unable to do an exercise stress test. A stress test will only be abnormal if one or more of the large coronary arteries is significantly blocked.  Blood tests. Tests may include samples to detect heart muscle damage (such as troponin levels). Other tests may include cholesterol checks and an inflammation test (high-sensitivity C-reactive protein, hs-CRP).  Coronary angiography.  Screening people who have peripheral vascular disease (PAD). These people often times have CAD. TREATMENT  The treatment of CAD includes the following:  Lifestyle changes such as:  Following a heart-healthy diet. A registered dietitian can you help educate you on healthy food options and changes.  Quiting smoking.  Following an exercise program approved by your caregiver.  Maintaining a healthy weight. Lose weight as approved by your caregiver.  Medicines to help control your blood pressure, cholesterol level, angina, and blood clotting. Medicines may include beta-blockers, ACE inhibitors, statins, nitrates, and anti-platelet medicines.  If you have a heart stent and are taking anti-platelet medicine, it is important to not suddenly stop taking this medicine. Suddenly stopping anti-platelet medicine can result in an MI. Talk with your caregiver before stopping medicine or if you cannot afford your medicine.  If the coronary arteries are significantly blocked, surgery may be needed. This can include:  Percutaneous coronary intervention (PCI) with or without stent placement.    Coronary artery bypass graft surgery (CABG). SEEK IMMEDIATE MEDICAL CARE IF:   You develop MI symptoms. This is a medical emergency. Get  help at once. Call your local emergency service (911 in the [place] immediately. Do not drive yourself to the clinic or hospital. MI symptoms can include:  Angina or pain that occurs in the neck, arm, jaw, or in the upper middle back.  Profuse sweating without cause.  Shortness of breath or difficulty breathing without cause.  Unexplained nausea or epigastric pain that feels like heartburn. Document Released: 10/24/2011 Document Reviewed: 10/24/2011 [facility] Patient Information 2014 [place], [place]. Hypertension As your heart beats, it forces blood through your arteries. This force is your blood pressure. If the pressure is too high, it is called hypertension (HTN) or high blood pressure. HTN is dangerous because you may have it and not know it. High blood pressure may mean that your heart has to work harder to pump blood. Your arteries may be narrow or stiff. The extra work puts you at risk for heart disease, stroke, and other problems.  Blood pressure consists of two numbers, a higher number over a lower, 110/72, for example. It is stated as "110 over 72." The ideal is below 120 for the top number (systolic) and under 80 for the bottom (diastolic). Write down your blood pressure today. You should pay close attention to your blood pressure if you have certain conditions such as:  Heart failure.  Prior heart attack.  Diabetes  Chronic kidney disease.  Prior stroke.  Multiple risk factors for heart disease. To see if you have HTN, your blood pressure should be measured while you are seated with your arm held at the level of the heart. It should be measured at least twice. A one-time elevated blood pressure reading (especially in the Emergency Department) does not mean that you need treatment. There may be conditions in which the blood pressure is different between your right and left arms. It is important to see your caregiver soon for a recheck. Most people have essential hypertension which  means that there is not a specific cause. This type of high blood pressure may be lowered by changing lifestyle factors such as:  Stress.  Smoking.  Lack of exercise.  Excessive weight.  Drug/tobacco/alcohol use.  Eating less salt. Most people do not have symptoms from high blood pressure until it has caused damage to the body. Effective treatment can often prevent, delay or reduce that damage. TREATMENT  When a cause has been identified, treatment for high blood pressure is directed at the cause. There are a large number of medications to treat HTN. These fall into several categories, and your caregiver will help you select the medicines that are best for you. Medications may have side effects. You should review side effects with your caregiver. If your blood pressure stays high after you have made lifestyle changes or started on medicines,   Your medication(s) may need to be changed.  Other problems may need to be addressed.  Be certain you understand your prescriptions, and know how and when to take your medicine.  Be sure to follow up with your caregiver within the time frame advised (usually within two weeks) to have your blood pressure rechecked and to review your medications.  If you are taking more than one medicine to lower your blood pressure, make sure you know how and at what times they should be taken. Taking two medicines at the same time can result in blood pressure  that is too low. SEEK IMMEDIATE MEDICAL CARE IF:  You develop a severe headache, blurred or changing vision, or confusion.  You have unusual weakness or numbness, or a faint feeling.  You have severe chest or abdominal pain, vomiting, or breathing problems. MAKE SURE YOU:   Understand these instructions.  Will watch your condition.  Will get help right away if you are not doing well or get worse. Document Released: 08/01/2005 Document Revised: 10/24/2011 Document Reviewed: 03/21/2008 [facility]  Patient Information 2014 [place], [place]. Chronic Pain Chronic pain can be defined as pain that is lasting, off and on, and lasts for 3 to 6 months or longer. Many things cause chronic pain, which can make it difficult to make a discrete diagnosis. There are many treatment options available for chronic pain. However, finding a treatment that works well for you may require trying various approaches until a suitable one is found. CAUSES  In some types of chronic medical conditions, the pain is caused by a normal pain response within the body. A normal pain response helps the body identify illness or injury and prevent further damage from being done. In these cases, the cause of the pain may be identified and treated, even if it may not be cured completely. Examples of chronic conditions which can cause chronic pain include:  Inflammation of the joints (arthritis).  Back pain or neck pain (including bulging or herniated disks).  Migraine headaches.  Cancer. In some other types of chronic pain syndromes, the pain is caused by an abnormal pain response within the body. An abnormal pain response is present when there is no ongoing cause (or stimulus) for the pain, or when the cause of the pain is arising from the nerves or nervous system itself. Examples of conditions which can cause chronic pain due to an abnormal pain response include:  Fibromyalgia.  Reflex sympathetic dystrophy (RSD).  Neuropathy (when the nerves themselves are damaged, and may cause pain). DIAGNOSIS  Your caregiver will help diagnose your condition over time. In many cases, the initial focus will be on excluding conditions that could be causing the pain. Depending on your symptoms, your caregiver may order some tests to diagnose your condition. Some of these tests include:  Blood tests.  Computerized X-ray scans (CT scan).  Computerized magnetic scans (MRI).  X-rays.  Ultrasounds.  Nerve conduction  studies.  Consultation with other physicians or specialists. TREATMENT  There are many treatment options for people suffering from chronic pain. Finding a treatment that works well may take time.   You may be referred to a pain management specialist.  You may be put on medication to help with the pain. Unfortunately, some medications (such as opiate medications) may not be very effective in cases where chronic pain is due to abnormal pain responses. Finding the right medications can take some time.  Adjunctive therapies may be used to provide additional relief and improve a patient's quality of life. These therapies include:  Mindfulness meditation.  Acupuncture.  Biofeedback.  Cognitive-behavioral therapy.  In certain cases, surgical interventions may be attempted. HOME CARE INSTRUCTIONS   Make sure you understand these instructions prior to discharge.  Ask any questions and share any further concerns you have with your caregiver prior to discharge.  Take all medications as directed by your caregiver.  Keep all follow-up appointments. SEEK MEDICAL CARE IF:   Your pain gets worse.  You develop a new pain that was not present before.  You cannot tolerate any medications prescribed  by your caregiver.  You develop new symptoms since your last visit with your caregiver. SEEK IMMEDIATE MEDICAL CARE IF:   You develop muscular weakness.  You have decreased sensation or numbness.  You lose control of bowel or bladder function.  Your pain suddenly gets much worse.  You have an oral temperature above 102 F (38.9 C), not controlled by medication.  You develop shaking chills, confusion, chest pain, or shortness of breath. Document Released: 04/23/2002 Document Revised: 10/24/2011 Document Reviewed: 07/30/2008 [facility] Patient Information 2014 [place], [place]. Anxiety and Panic Attacks Anxiety is your body's way of reacting to real danger or something you think is a  danger. It may be fear or worry over a situation like losing your job. Sometimes the cause is not known. A panic attack is made up of physical signs like sweating, shaking, or chest pain. Anxiety and panic attacks may start suddenly. They may be strong. They may come at any time of day, even while sleeping. They may come at any time of life. Panic attacks are scary, but they do not harm you physically.  HOME CARE  Avoid any known causes of your anxiety.  Try to relax. Yoga may help. Tell yourself everything will be okay.  Exercise often.  Get expert advice and help (therapy) to stop anxiety or attacks from happening.  Avoid caffeine, alcohol, and drugs.  Only take medicine as told by your doctor. GET HELP RIGHT AWAY IF:  Your attacks seem different than normal attacks.  Your problems are getting worse or concern you. MAKE SURE YOU:  Understand these instructions.  Will watch your condition.  Will get help right away if you are not doing well or get worse. Document Released: 09/03/2010 Document Revised: 10/24/2011 Document Reviewed: 09/03/2010 [facility] Patient Information 2014 [place], [place].

## 2013-01-04 NOTE — Progress Notes (Signed)
Patient ID: Leonard Murray, male   DOB: [birth year], 52 y.o.   MRN: [id number]  HPI: Leonard Murray, is a 52 y.o. male who presents to the office today for 8 month followup cardiologicevaluation. Mr. Fedewa has established coronary artery disease in May 2009 underwent stenting of his mid and distal right coronary artery. His last catheterization was in August 2000 and shows patent stents. He had mild 20% stenosis in the LAD and mild 10-20% proximal and distal RCA stenoses. He also had an area PLA spasm which did improve with IC nitroglycerin administration. The patient has a history of ongoing tobacco use but is really trying hard to quit. He started smoking at age 63. He does have a history of significant peripheral neuropathy. He also has a history of hypertension, anxiety, mixed hyperlipidemia, and chronic pain syndrome. Apparently he had been hospitalized also for possible dehydration several months ago. Apparently he stopped taking hisBystolic at that time.  Past Medical History  Diagnosis Date  . CAD (coronary artery disease) 12/2007    AMI with stenting; retinal embolus 08/2005; stenting 03/2009  . Hyperlipidemia   . Hypertension   . OSA (obstructive sleep apnea)     not using CPAP-feels suffocating  . GAD (generalized anxiety disorder)   . Depression   . Chronic back pain     injuries 1998, 2008, 01/2009  . Peripheral neuropathy   . Hypogonadism male     can't afford testosterone    History reviewed. No pertinent past surgical history.  No Known Allergies  Current Outpatient Prescriptions  Medication Sig Dispense Refill  . ALPRAZolam (XANAX) 1 MG tablet Take 1 mg by mouth 3 (three) times daily as needed. For anxiety      . amitriptyline (ELAVIL) 100 MG tablet Take 1 tablet (100 mg total) by mouth at bedtime.  30 tablet  3  . amLODipine (NORVASC) 5 MG tablet Take 5 mg by mouth daily.      Marland Kitchen aspirin EC 81 MG tablet Take 81 mg by mouth daily.      . B Complex-C (SUPER B COMPLEX PO) Take  1 capsule by mouth daily.      . clopidogrel (PLAVIX) 75 MG tablet Take 1 tablet (75 mg total) by mouth daily.  30 tablet  3  . fish oil-omega-3 fatty acids 1000 MG capsule Take 1 g by mouth daily.      Marland Kitchen gabapentin (NEURONTIN) 300 MG capsule Take 300 mg by mouth 3 (three) times daily.      . hydrochlorothiazide (MICROZIDE) 12.5 MG capsule Take 1 capsule (12.5 mg total) by mouth daily.  30 capsule  6  . isosorbide mononitrate (IMDUR) 60 MG 24 hr tablet Take 0.5 tablets (30 mg total) by mouth every morning.  30 tablet  0  . lisinopril (PRINIVIL,ZESTRIL) 20 MG tablet Take 1 tablet (20 mg total) by mouth daily.  30 tablet  0  . nitroGLYCERIN (NITROSTAT) 0.4 MG SL tablet Place 1 tablet (0.4 mg total) under the tongue every 5 (five) minutes as needed.  30 tablet  1  . oxyCODONE (OXYCONTIN) 20 MG 12 hr tablet Take 20 mg by mouth every 12 (twelve) hours.      Marland Kitchen oxyCODONE (ROXICODONE) 15 MG immediate release tablet Take 15 mg by mouth every 4 (four) hours.      Marland Kitchen PARoxetine (PAXIL) 20 MG tablet Take 1 tablet (20 mg total) by mouth every morning.  30 tablet  0  . pregabalin (LYRICA) 75 MG capsule  Take 1 capsule (75 mg total) by mouth 3 (three) times daily.  90 capsule  0  . simvastatin (ZOCOR) 40 MG tablet Take 1 tablet (40 mg total) by mouth at bedtime. Needs appointment  30 tablet  0  . nebivolol (BYSTOLIC) 10 MG tablet Take 0.5 tablets (5 mg total) by mouth daily.  30 tablet  6  . zolpidem (AMBIEN) 10 MG tablet Take 10 mg by mouth at bedtime as needed. For insomnia      . [DISCONTINUED] furosemide (LASIX) 20 MG tablet Take 1 tablet (20 mg total) by mouth 2 (two) times daily.  10 tablet  0   No current facility-administered medications for this visit.    Socially, he is divorced. He has one child. He still smoking or tobacco. He does admit to chronic pain. He does drink occasional beer.  ROV is negative for fever chills night sweats. He does wear glasses. He does note some mild shortness of breath  with activity. He denies recent chest pressure. His leg swelling did improve with reduction of his amlodipine dose institution of low-dose as needed HCTZ. He does have numbness of his feet which has improved since initiating Lyrica. He denies rash. Other system review is negative.  PE BP 120/76  Pulse 79  Ht 5\' 10"  (1.778 m)  Wt 221 lb (100.245 kg)  BMI 31.71 kg/m2  General: Alert, oriented, no distress.  HEENT: Normocephalic, atraumatic. Pupils round and reactive; sclera anicteric;  Nares without nasal septal hypertrophy Mouth/Parynx benign; Mallinpatti scale 2/3 Neck: No JVD, no carotid briuts Lungs: decreased BS without wheezing Heart: RRR, s1 s2 normal 1/6 SEM Abdomen: soft, nontender; no hepatosplenomehaly, BS+; abdominal aorta nontender and not dilated by palpation. Pulses 2+ Extremities: no clubbinbg cyanosis or edema, Homan's sign negative  Neurologic: grossly nonfocal   ECG: sinus rhythm at 79 beats per minute. Left axis deviation. PR interval 162 onset in, QTc interval 410 seconds.   LABS:  BMET    Component Value Date/Time   NA 138 09/25/2012 1611   K 3.6 09/25/2012 1611   CL 99 09/25/2012 1611   CO2 24 09/25/2012 1611   GLUCOSE 101* 09/25/2012 1611   BUN 9 09/25/2012 1611   CREATININE 0.59 09/25/2012 1611   CREATININE 0.80 12/01/2011 1202   CALCIUM 9.5 09/25/2012 1611   GFRNONAA >90 09/25/2012 1611   GFRAA >90 09/25/2012 1611     Hepatic Function Panel     Component Value Date/Time   PROT 8.0 09/25/2012 1611   ALBUMIN 4.3 09/25/2012 1611   AST 33 09/25/2012 1611   ALT 62* 09/25/2012 1611   ALKPHOS 76 09/25/2012 1611   BILITOT 0.6 09/25/2012 1611     CBC    Component Value Date/Time   WBC 9.4 09/26/2012 1137   WBC 8.3 12/01/2011 1207   RBC 5.05 09/26/2012 1137   RBC 4.43* 12/01/2011 1207   HGB 16.6 09/26/2012 1137   HGB 14.6 12/01/2011 1207   HCT 46.0 09/26/2012 1137   HCT 44.2 12/01/2011 1207   PLT 207 09/26/2012 1137   MCV 91.1 09/26/2012 1137   MCV 99.7*  12/01/2011 1207   MCH 32.9 09/26/2012 1137   MCH 33.0* 12/01/2011 1207   MCHC 36.1* 09/26/2012 1137   MCHC 33.0 12/01/2011 1207   RDW 13.2 09/26/2012 1137   LYMPHSABS 2.4 06/15/2012 1848   MONOABS 1.3* 06/15/2012 1848   EOSABS 0.1 06/15/2012 1848   BASOSABS 0.0 06/15/2012 1848     BNP No results found for this  basename: probnp    Lipid Panel     Component Value Date/Time   CHOL 168 09/25/2012 1611   TRIG 105 09/25/2012 1611   HDL 47 09/25/2012 1611   CHOLHDL 3.6 09/25/2012 1611   VLDL 21 09/25/2012 1611   LDLCALC 100* 09/25/2012 1611     RADIOLOGY: No results found.    ASSESSMENT AND PLAN: From a cardiac standpoint, Mr. Geffre has been stable without angina symptoms. We again discussed complete smoking cessation. He does admit to significant anxiety recently in his attempt to quit smoking as well as with his recent grandmother's death. He is undergoing pain management for his chronic pain syndrome. I have recommended reinstitution of a lower dose of a Bystolic 5 mg which is reduced from his previous dose of 10 mg. He has not had any orthostatic symptomatology. The patient had been on pravastatin 40 mg for his hyperlipidemia. We will need to verify his present treatment since if this was switched to simvastatin since he is on concomitant amlodipine simvastatin dose may will need to be reduced to 20 mgpossibly set at may need to be a threat. Alternatively we can switch him to atorvastatin 40 mg target LDL goal less than 70. I have recommended increased exercise.I will see him in 6 months for re-evaluation.       Lennette Bihari, MD, [facility]  01/04/2013 2:41 PM

## 2013-01-04 NOTE — Progress Notes (Signed)
Patient states he needs a referral for eyeglasses and for a dental appointment. Also patient states that he asked his pain management specialist if her could get his xanax to be increased from 1mg  to 2 mg and was told that he would have to ask his PCP to increase that dosage.

## 2013-01-04 NOTE — Progress Notes (Signed)
Patient ID: Leonard Murray, male   DOB: [birth year], 52 y.o.   MRN: [id number]  CC: Needed referrals and anxiety  HPI: Pt asking for increasing dose of xanax.  He is currently receiving Xanax prescriptions from his pain management specialist.  He reports that he's been taking 1 mg 3 times a day for over 20 years.  He reports that he is having anxiety.  He reports that he runs out of his medications for Xanax and then he has withdrawal symptoms.  He reports that he takes more than the prescribed at times.  He says that 1 mg doesn't do anything for him he is requesting for me to give him more prescriptions for Xanax.  He reports that he is seeing a psychiatrist in the past but has not seen one recently.  He is planning on going to see his former psychiatrist when he can get an appointment  No Known Allergies Past Medical History  Diagnosis Date  . CAD (coronary artery disease) 12/2007    AMI with stenting; retinal embolus 08/2005; stenting 03/2009  . Hyperlipidemia   . Hypertension   . OSA (obstructive sleep apnea)     not using CPAP-feels suffocating  . GAD (generalized anxiety disorder)   . Depression   . Chronic back pain     injuries 1998, 2008, 01/2009  . Peripheral neuropathy   . Hypogonadism male     can't afford testosterone   Current Outpatient Prescriptions on File Prior to Visit  Medication Sig Dispense Refill  . ALPRAZolam (XANAX) 1 MG tablet Take 1 mg by mouth 3 (three) times daily as needed. For anxiety      . amitriptyline (ELAVIL) 100 MG tablet Take 1 tablet (100 mg total) by mouth at bedtime.  30 tablet  3  . amLODipine (NORVASC) 5 MG tablet Take 5 mg by mouth daily.      Marland Kitchen aspirin EC 81 MG tablet Take 81 mg by mouth daily.      . clopidogrel (PLAVIX) 75 MG tablet Take 1 tablet (75 mg total) by mouth daily.  30 tablet  3  . fish oil-omega-3 fatty acids 1000 MG capsule Take 1 g by mouth daily.      . hydrochlorothiazide (MICROZIDE) 12.5 MG capsule Take 1 capsule (12.5 mg  total) by mouth daily.  30 capsule  6  . isosorbide mononitrate (IMDUR) 60 MG 24 hr tablet Take 0.5 tablets (30 mg total) by mouth every morning.  30 tablet  0  . lisinopril (PRINIVIL,ZESTRIL) 20 MG tablet Take 1 tablet (20 mg total) by mouth daily.  30 tablet  0  . nebivolol (BYSTOLIC) 10 MG tablet Take 0.5 tablets (5 mg total) by mouth daily.  30 tablet  6  . nitroGLYCERIN (NITROSTAT) 0.4 MG SL tablet Place 1 tablet (0.4 mg total) under the tongue every 5 (five) minutes as needed.  30 tablet  1  . oxyCODONE (OXYCONTIN) 20 MG 12 hr tablet Take 20 mg by mouth every 12 (twelve) hours.      Marland Kitchen oxyCODONE (ROXICODONE) 15 MG immediate release tablet Take 15 mg by mouth every 4 (four) hours.      Marland Kitchen PARoxetine (PAXIL) 20 MG tablet Take 1 tablet (20 mg total) by mouth every morning.  30 tablet  0  . pregabalin (LYRICA) 75 MG capsule Take 1 capsule (75 mg total) by mouth 3 (three) times daily.  90 capsule  0  . simvastatin (ZOCOR) 40 MG tablet Take 1 tablet (40 mg  total) by mouth at bedtime. Needs appointment  30 tablet  0  . B Complex-C (SUPER B COMPLEX PO) Take 1 capsule by mouth daily.      Marland Kitchen zolpidem (AMBIEN) 10 MG tablet Take 10 mg by mouth at bedtime as needed. For insomnia      . [DISCONTINUED] furosemide (LASIX) 20 MG tablet Take 1 tablet (20 mg total) by mouth 2 (two) times daily.  10 tablet  0   No current facility-administered medications on file prior to visit.   History reviewed. No pertinent family history. History   Social History  . Marital Status: Divorced    Spouse Name: n/a    Number of Children: 1  . Years of Education: 11   Occupational History  . 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 Smoking status: Current Every Day Smoker -- 0.25 packs/day    Types: Cigarettes  . Smokeless tobacco: Not on file  . Alcohol Use: 0.6 oz/week    1 Cans of beer per week  . Drug Use: No  . Sexually Active: Yes -- Male partner(s)   Other Topics  Concern  . Not on file   Social History Narrative   Lives with girlfriend.  Adult son with history of substance abuse.  Has applied for disability.    Review of Systems  Constitutional: Negative for fever, chills, diaphoresis, activity change, appetite change and fatigue.  HENT: Negative for ear pain, nosebleeds, congestion, facial swelling, rhinorrhea, neck pain, neck stiffness and ear discharge.   Eyes: Negative for pain, discharge, redness, itching and visual disturbance.  Respiratory: Negative for cough, choking, chest tightness, shortness of breath, wheezing and stridor.   Cardiovascular: Negative for chest pain, palpitations and leg swelling.  Gastrointestinal: Negative for abdominal distention.  Genitourinary: Negative for dysuria, urgency, frequency, hematuria, flank pain, decreased urine volume, difficulty urinating and dyspareunia.  Musculoskeletal: Negative for back pain, joint swelling, arthralgias and gait problem.  Neurological: Negative for dizziness, tremors, seizures, syncope, facial asymmetry, speech difficulty, weakness, light-headedness, numbness and headaches.  Hematological: Negative for adenopathy. Does not bruise/bleed easily.  Psychiatric/Behavioral: Negative for hallucinations, behavioral problems, confusion, dysphoric mood, decreased concentration and agitation.    Objective:   Filed Vitals:   01/04/13 1540  BP: 133/80  Pulse: 92  Temp: 97.8 F (36.6 C)  Resp: 16    Physical Exam  Constitutional: Appears well-developed and well-nourished. No distress.  HENT: Normocephalic. External right and left ear normal. Oropharynx is clear and moist.  Eyes: Conjunctivae and EOM are normal. PERRLA, no scleral icterus.  Neck: Normal ROM. Neck supple. No JVD. No tracheal deviation. No thyromegaly.  CVS: RRR, S1/S2 +, no murmurs, no gallops, no carotid bruit.  Pulmonary: Effort and breath sounds normal, no stridor, rhonchi, wheezes, rales.  Abdominal: Soft. BS +,  no  distension, tenderness, rebound or guarding.  Musculoskeletal: Normal range of motion. No edema and no tenderness.  Lymphadenopathy: No lymphadenopathy noted, cervical, inguinal. Neuro: Alert. Normal reflexes, muscle tone coordination. No cranial nerve deficit. Skin: Skin is warm and dry. No rash noted. Not diaphoretic. No erythema. No pallor.  Psychiatric: Normal mood and affect. Behavior, judgment, thought content normal.   Lab Results  Component Value Date   WBC 9.4 09/26/2012   HGB 16.6 09/26/2012   HCT 46.0 09/26/2012   MCV 91.1 09/26/2012   PLT 207 09/26/2012   Lab Results  Component Value Date   CREATININE 0.59 09/25/2012  BUN 9 09/25/2012   NA 138 09/25/2012   K 3.6 09/25/2012   CL 99 09/25/2012   CO2 24 09/25/2012    Lab Results  Component Value Date   HGBA1C 5.5 09/25/2012   Lipid Panel     Component Value Date/Time   CHOL 168 09/25/2012 1611   TRIG 105 09/25/2012 1611   HDL 47 09/25/2012 1611   CHOLHDL 3.6 09/25/2012 1611   VLDL 21 09/25/2012 1611   LDLCALC 100* 09/25/2012 1611       Assessment and plan:   Patient Active Problem List   Diagnosis Date Noted  . Unspecified essential hypertension 01/04/2013  . Insomnia 01/04/2013  . Other and unspecified hyperlipidemia 01/04/2013  . Dental caries 01/04/2013  . Chronic pain 11/06/2012  . Prediabetes 11/06/2012  . Cervicalgia 11/06/2012  . Lumbar sprain 11/06/2012  . 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 Hypotension 06/16/2012  . H/O ETOH abuse 06/16/2012  . CAD (coronary artery disease)   . Hyperlipidemia   . Hypertension   . OSA (obstructive sleep apnea)   . GAD (generalized anxiety disorder)   . Depression   . Chronic back pain   . Peripheral neuropathy   . Hypogonadism male         Pt says that he is gonna go back to his original psychiatrist.  He declined to see a [place] psychiatrist at this time.  I gave him the information for the behavioral health ER for emergencies and told him that I did not feel  comfortable increasing his xanax dose at this time as he is receiving prescription from his pain management provider.  I explained to him that I did not feel comfortable writing Xanax for him and that he should not be getting these controlled substance prescriptions for more than one provider.  He verbalized understanding.  I told him that it was very dangerous for him to increase the dose of the benzodiazepines while he is taking the narcotic pain medications.  He verbalized understanding.  I strongly encouraged him to take his medications as prescribed and to not make changes to his medications without the direction of a physician.  He verbalized understanding to  Referral made for dentist and for eye care specialist.   The patient was given clear instructions to go to ER or return to medical center if symptoms don't improve, worsen or new problems develop.  The patient verbalized understanding.  The patient was told to call to get lab results if they haven't heard anything in the next week.    Rodney Langton, MD, CDE, FAAFP [facility] [place], [place]

## 2013-01-08 ENCOUNTER — Other Ambulatory Visit: Payer: Self-pay | Admitting: *Deleted

## 2013-01-08 MED ORDER — NITROGLYCERIN 0.4 MG SL SUBL: 0.4000 mg | SUBLINGUAL_TABLET | SUBLINGUAL | Status: DC | PRN

## 2013-01-08 NOTE — Telephone Encounter (Signed)
NTG refilled x 5

## 2013-01-09 ENCOUNTER — Telehealth: Payer: Self-pay | Admitting: *Deleted

## 2013-01-09 NOTE — Telephone Encounter (Signed)
01/09/13 Attempt to reach patient regarding need for paper work to be filled out P.Luiz Ochoa

## 2013-01-18 ENCOUNTER — Other Ambulatory Visit: Payer: Self-pay | Admitting: Physician Assistant

## 2013-01-18 ENCOUNTER — Telehealth: Payer: Self-pay | Admitting: Cardiovascular Disease

## 2013-01-18 NOTE — Telephone Encounter (Signed)
Feet were swelling-now his calves are cramping up-thinks his medicine might need to be changed!_please call!

## 2013-01-18 NOTE — Telephone Encounter (Signed)
Returned call and informed pt per instructions by MD/PA.  Pt verbalized understanding and agreed w/ plan.  

## 2013-01-18 NOTE — Telephone Encounter (Signed)
Returned call.  Pt stated the last time he came in Dr. Tresa Endo wanted him to start taking Bystolic again and a fluid pill.  Pt c/o feet and leg cramping "real bad" since he restarted the bystolic.  Pt also c/o intermittent dizziness.  No BP monitoring.  Pt informed RN will discuss with MD/PA for further instructions.  Pt verbalized understanding and agreed w/ plan.  Message forwarded to Hinda Glatter, PA-C for further instructions.

## 2013-01-18 NOTE — Telephone Encounter (Signed)
OK to stop Bystolic, see if symptoms improve

## 2013-01-22 ENCOUNTER — Other Ambulatory Visit: Payer: Self-pay

## 2013-01-22 MED ORDER — ISOSORBIDE MONONITRATE ER 60 MG PO TB24: 30.0000 mg | ORAL_TABLET | Freq: Every morning | ORAL | Status: DC

## 2013-01-30 ENCOUNTER — Telehealth: Payer: Self-pay | Admitting: Cardiovascular Disease

## 2013-01-30 NOTE — Telephone Encounter (Signed)
States that he left his eye doctor's office and felt very faint.  Got home and went to his neighbor's home--sweating a lot, arms twitching and almost blacked out.  States that this has happened in the past and he was admitted to the hospital. Please call ASAP.

## 2013-01-30 NOTE — Telephone Encounter (Signed)
Returned call.  Pt with c/o feeling faint, like he was going to pass out, arms twitching and sweating.  Stated when it happened before he was admitted to the hospital for malnutrition and dehydration.  Pt stated he took a xanax and is feeling better, just sweating a little.  Pt did not want to go to ER.  Offered appt tomorrow and pt declined.  Stated he will see how he does tonight and call back.  Pt advised he needs to be seen in ER if symptoms return.  Pt verbalized understanding and agreed w/ plan.  Message forwarded to Dr. Tresa Endo [facility]).

## 2013-02-19 ENCOUNTER — Other Ambulatory Visit: Payer: Self-pay | Admitting: Physician Assistant

## 2013-02-20 ENCOUNTER — Other Ambulatory Visit: Payer: Self-pay | Admitting: Cardiovascular Disease

## 2013-02-20 ENCOUNTER — Ambulatory Visit: Payer: No Typology Code available for payment source

## 2013-02-20 NOTE — Telephone Encounter (Signed)
Rx was sent to pharmacy electronically. 

## 2013-03-05 ENCOUNTER — Ambulatory Visit: Payer: No Typology Code available for payment source

## 2013-03-07 ENCOUNTER — Telehealth: Payer: Self-pay | Admitting: Family Medicine

## 2013-03-07 NOTE — Telephone Encounter (Signed)
Pt would like Dr. Laural Benes to know that he is interested in a shoulder brace, and that he is fearful that a screw may be coming loose in his hip. He has an appt 8/11 but would like Dr. Laural Benes to know before hand.

## 2013-03-07 NOTE — Telephone Encounter (Signed)
Please call patient and find out what hip the screw is in that is coming loose and order an xray of that hip to be done to make sure the screw is still in place.  Please go ahead and place the order for the hip xray and have patient go ahead and get the xray done now.    Rodney Langton, MD, CDE, FAAFP [facility] [place], [place]

## 2013-03-08 ENCOUNTER — Telehealth: Payer: Self-pay | Admitting: *Deleted

## 2013-03-08 NOTE — Telephone Encounter (Signed)
03/08/13 Attempt to reach patient not available message left for patient to call clinic doctor needs to know whicht hip The screw is coming loose so that an X-Ray can be order per Dr. Laural Benes. P.Yossef Gilkison,RN BSN [facility]

## 2013-03-15 DIAGNOSIS — Z0271 Encounter for disability determination: Secondary | ICD-10-CM

## 2013-03-25 ENCOUNTER — Other Ambulatory Visit: Payer: Self-pay | Admitting: Cardiovascular Disease

## 2013-03-25 NOTE — Telephone Encounter (Signed)
Rx was sent to pharmacy electronically. 

## 2013-03-28 ENCOUNTER — Ambulatory Visit: Payer: Medicaid Other | Attending: Family Medicine | Admitting: Internal Medicine

## 2013-03-28 VITALS — BP 144/87 | HR 88 | Temp 97.7°F | Ht 70.0 in | Wt 237.0 lb

## 2013-03-28 DIAGNOSIS — G609 Hereditary and idiopathic neuropathy, unspecified: Secondary | ICD-10-CM

## 2013-03-28 LAB — CBC WITH DIFFERENTIAL/PLATELET
Hemoglobin: 15.6 g/dL (ref 13.0–17.0)
Lymphocytes Relative: 28 % (ref 12–46)
Lymphs Abs: 2.2 10*3/uL (ref 0.7–4.0)
MCH: 31.8 pg (ref 26.0–34.0)
Monocytes Relative: 7 % (ref 3–12)
Neutro Abs: 4.9 10*3/uL (ref 1.7–7.7)
Neutrophils Relative %: 64 % (ref 43–77)
Platelets: 220 10*3/uL (ref 150–400)
RBC: 4.9 MIL/uL (ref 4.22–5.81)
WBC: 7.7 10*3/uL (ref 4.0–10.5)

## 2013-03-28 LAB — COMPREHENSIVE METABOLIC PANEL
AST: 27 U/L (ref 0–37)
Albumin: 4.4 g/dL (ref 3.5–5.2)
BUN: 13 mg/dL (ref 6–23)
Calcium: 9.5 mg/dL (ref 8.4–10.5)
Chloride: 104 mEq/L (ref 96–112)
Glucose, Bld: 126 mg/dL — ABNORMAL HIGH (ref 70–99)
Potassium: 4.9 mEq/L (ref 3.5–5.3)
Sodium: 140 mEq/L (ref 135–145)
Total Protein: 7 g/dL (ref 6.0–8.3)

## 2013-03-28 MED ORDER — LISINOPRIL 20 MG PO TABS: 20.0000 mg | ORAL_TABLET | Freq: Every day | ORAL | Status: DC

## 2013-03-28 MED ORDER — NITROGLYCERIN 0.4 MG SL SUBL: 0.4000 mg | SUBLINGUAL_TABLET | SUBLINGUAL | Status: DC | PRN

## 2013-03-28 MED ORDER — FUROSEMIDE 40 MG PO TABS: 40.0000 mg | ORAL_TABLET | Freq: Every day | ORAL | Status: DC

## 2013-03-28 NOTE — Progress Notes (Unsigned)
Patient presents for follow up of lower neuropathy; left shoulder pain and lower back pain; needs medication refills.

## 2013-03-28 NOTE — Progress Notes (Unsigned)
Patient ID: Leonard Murray, male   DOB: 1960-09-05, 52 y.o.   MRN: [id number]  CC:  HPI: 52 year old male with a history of coronary artery  disease, hypertension, extensive surgical history with multiple surgeries in [place], , who presents today for followup. The patient keeps talking about his chronic back pain his neuropathy, he talks about some urinary retention, he talks about impotence, and wants  his testosterone to be checked. Denies any chest pain any shortness of breath. He denies any swelling in his legs. He wants me to change his hydrochlorothiazide to Lasix. He was recently in the ER and received Lasix in the ER that  helped with the swelling in his legs He was recently asked by his cardiologist to quit taking bystolic  No Known Allergies Past Medical History  Diagnosis Date  . CAD (coronary artery disease) 12/2007    AMI with stenting; retinal embolus 08/2005; stenting 03/2009  . Hyperlipidemia   . Hypertension   . OSA (obstructive sleep apnea)     not using CPAP-feels suffocating  . GAD (generalized anxiety disorder)   . Depression   . Chronic back pain     injuries 1998, 2008, 01/2009  . Peripheral neuropathy   . Hypogonadism male     can't afford testosterone   Current Outpatient Prescriptions on File Prior to Visit  Medication Sig Dispense Refill  . ALPRAZolam (XANAX) 1 MG tablet Take 1 mg by mouth 3 (three) times daily as needed. For anxiety      . amitriptyline (ELAVIL) 100 MG tablet Take 1 tablet (100 mg total) by mouth at bedtime.  30 tablet  3  . amLODipine (NORVASC) 5 MG tablet TAKE ONE (1) TABLET BY MOUTH EVERY DAY  30 tablet  10  . aspirin EC 81 MG tablet Take 81 mg by mouth daily.      . B Complex-C (SUPER B COMPLEX PO) Take 1 capsule by mouth daily.      . clopidogrel (PLAVIX) 75 MG tablet Take 1 tablet (75 mg total) by mouth daily.  30 tablet  3  . fish oil-omega-3 fatty acids 1000 MG capsule Take 1 g by mouth daily.      . isosorbide mononitrate (IMDUR) 60  MG 24 hr tablet Take 0.5 tablets (30 mg total) by mouth every morning. PATIENT NEEDS OFFICE VISIT FOR ADDITIONAL REFILLS  15 tablet  0  . oxyCODONE (OXYCONTIN) 20 MG 12 hr tablet Take 20 mg by mouth every 12 (twelve) hours.      Marland Kitchen oxyCODONE (ROXICODONE) 15 MG immediate release tablet Take 15 mg by mouth every 4 (four) hours.      Marland Kitchen PARoxetine (PAXIL) 20 MG tablet Take 1 tablet (20 mg total) by mouth every morning.  30 tablet  0  . pregabalin (LYRICA) 75 MG capsule Take 1 capsule (75 mg total) by mouth 3 (three) times daily.  90 capsule  0  . simvastatin (ZOCOR) 40 MG tablet Take 1 tablet (40 mg total) by mouth at bedtime. Needs office visit and labs-final notice  7 tablet  0  . zolpidem (AMBIEN) 10 MG tablet Take 10 mg by mouth at bedtime as needed. For insomnia       No current facility-administered medications on file prior to visit.   History reviewed. No pertinent family history. History   Social History  . Marital Status: Divorced    Spouse Name: n/a    Number of Children: 1  . Years of Education: 79   Occupational  History  . 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 Smoking status: Current Every Day Smoker -- 0.25 packs/day    Types: Cigarettes  . Smokeless tobacco: Not on file  . Alcohol Use: 0.6 oz/week    1 Cans of beer per week  . Drug Use: No  . Sexual Activity: Yes    Partners: Female   Other Topics Concern  . Not on file   Social History Narrative   Lives with girlfriend.  Adult son with history of substance abuse.  Has applied for disability.    Review of Systems  Constitutional: Negative for fever, chills, diaphoresis, activity change, appetite change and fatigue.  HENT: Negative for ear pain, nosebleeds, congestion, facial swelling, rhinorrhea, neck pain, neck stiffness and ear discharge.   Eyes: Negative for pain, discharge, redness, itching and visual disturbance.  Respiratory: Negative for cough, choking, chest  tightness, shortness of breath, wheezing and stridor.   Cardiovascular: Negative for chest pain, palpitations and leg swelling.  Gastrointestinal: Negative for abdominal distention.  Genitourinary: Negative for dysuria, urgency, frequency, hematuria, flank pain, decreased urine volume, difficulty urinating and dyspareunia.  Musculoskeletal: Negative for back pain, joint swelling, arthralgias and gait problem.  Neurological: Negative for dizziness, tremors, seizures, syncope, facial asymmetry, speech difficulty, weakness, light-headedness, numbness and headaches.  Hematological: Negative for adenopathy. Does not bruise/bleed easily.  Psychiatric/Behavioral: Negative for hallucinations, behavioral problems, confusion, dysphoric mood, decreased concentration and agitation.    Objective:   Filed Vitals:   03/28/13 1054  BP: 144/87  Pulse: 88  Temp: 97.7 F (36.5 C)    Physical Exam  Constitutional: Appears well-developed and well-nourished. No distress.  HENT: Normocephalic. External right and left ear normal. Oropharynx is clear and moist.  Eyes: Conjunctivae and EOM are normal. PERRLA, no scleral icterus.  Neck: Normal ROM. Neck supple. No JVD. No tracheal deviation. No thyromegaly.  CVS: RRR, S1/S2 +, no murmurs, no gallops, no carotid bruit.  Pulmonary: Effort and breath sounds normal, no stridor, rhonchi, wheezes, rales.  Abdominal: Soft. BS +,  no distension, tenderness, rebound or guarding.  Musculoskeletal: Normal range of motion. No edema and no tenderness.  Lymphadenopathy: No lymphadenopathy noted, cervical, inguinal. Neuro: Alert. Normal reflexes, muscle tone coordination. No cranial nerve deficit. Skin: Skin is warm and dry. No rash noted. Not diaphoretic. No erythema. No pallor.  Psychiatric: Normal mood and affect. Behavior, judgment, thought content normal.   Lab Results  Component Value Date   WBC 9.4 09/26/2012   HGB 16.6 09/26/2012   HCT 46.0 09/26/2012   MCV 91.1  09/26/2012   PLT 207 09/26/2012   Lab Results  Component Value Date   CREATININE 0.59 09/25/2012   BUN 9 09/25/2012   NA 138 09/25/2012   K 3.6 09/25/2012   CL 99 09/25/2012   CO2 24 09/25/2012    Lab Results  Component Value Date   HGBA1C 5.5 09/25/2012   Lipid Panel     Component Value Date/Time   CHOL 168 09/25/2012 1611   TRIG 105 09/25/2012 1611   HDL 47 09/25/2012 1611   CHOLHDL 3.6 09/25/2012 1611   VLDL 21 09/25/2012 1611   LDLCALC 100* 09/25/2012 1611       Assessment and plan:   Patient Active Problem List   Diagnosis Date Noted  . Unspecified essential hypertension 01/04/2013  . Insomnia 01/04/2013  . Other and unspecified hyperlipidemia 01/04/2013  . Dental caries 01/04/2013  . Chronic  pain 11/06/2012  . Prediabetes 11/06/2012  . Cervicalgia 11/06/2012  . Lumbar sprain 11/06/2012  . 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 Hypotension 06/16/2012  . H/O ETOH abuse 06/16/2012  . CAD (coronary artery disease)   . Hyperlipidemia   . Hypertension   . OSA (obstructive sleep apnea)   . GAD (generalized anxiety disorder)   . Depression   . Chronic back pain   . Peripheral neuropathy   . Hypogonadism male    Hypertension bystolic discontinued by cardiology    Discontinue hydrochlorothiazide  Lasix 40 mg daily Labs to be checked today Patient instructed to call for labs results in the morning   Nicotine dependence Patient will discuss about Wellbutrin with his psychiatrist Probably not a good candidate for Chantix  Urinary retention Urology referral will be provided

## 2013-03-29 LAB — PSA: PSA: 0.71 ng/mL (ref <=4.00)

## 2013-03-29 LAB — VITAMIN B12: Vitamin B-12: 445 pg/mL (ref 211–911)

## 2013-03-29 LAB — TESTOSTERONE: Testosterone: 267 ng/dL — ABNORMAL LOW (ref 300–890)

## 2013-04-02 ENCOUNTER — Telehealth: Payer: Self-pay

## 2013-04-02 LAB — URINALYSIS W MICROSCOPIC + REFLEX CULTURE

## 2013-04-02 NOTE — Telephone Encounter (Signed)
LAB CALLED THEY DID NOT RECEIVE URINE SAMPLE TEST WAS CANCELLED-SPOKE WITH PATIENT PATIENT HAS URETHRA PROBLEMS AS WELL AS PROSTATE ISSUES CAN NOT VOID ON DEMAND.  WILL TRY TO DROP OFF URINE SAMPLE IN THE AM MIKA IN THE LAB IS AWARE

## 2013-04-23 ENCOUNTER — Telehealth: Payer: Self-pay | Admitting: Emergency Medicine

## 2013-04-24 ENCOUNTER — Telehealth: Payer: Self-pay | Admitting: Emergency Medicine

## 2013-04-24 ENCOUNTER — Other Ambulatory Visit: Payer: Self-pay | Admitting: Internal Medicine

## 2013-04-24 MED ORDER — AMITRIPTYLINE HCL 100 MG PO TABS: 75.0000 mg | ORAL_TABLET | Freq: Every day | ORAL | Status: DC

## 2013-04-24 MED ORDER — CLOPIDOGREL BISULFATE 75 MG PO TABS: 75.0000 mg | ORAL_TABLET | Freq: Every day | ORAL | Status: DC

## 2013-04-24 NOTE — Progress Notes (Signed)
Patient needed prescription for Elavil and Plavix. In addition we have reviewed the recent blood work. Recent blood work significant for a slightly lower end of range of testosterone. Patient has next appointment in November 2014 at which time we will repeat testosterone level.

## 2013-04-24 NOTE — Telephone Encounter (Signed)
PT HERE FOR LAB RESULTS AND MEDICATION REFILL ELAVIL AND PLAVIX. SPOKE WITH DR. Elisabeth Pigeon. SCRIPTS GIVEN AND PT INFORMED TO HAVE REPEAT TESTERONE LEVELS DONE WITH NEXT APPT

## 2013-04-24 NOTE — Telephone Encounter (Signed)
Pt wants lab reports, will come to pick up in 15 minutes

## 2013-04-26 NOTE — Telephone Encounter (Signed)
PT CAME IN PERSON AND GIVEN LAB RESULTS

## 2013-05-08 ENCOUNTER — Emergency Department (HOSPITAL_COMMUNITY)
Admission: EM | Admit: 2013-05-08 | Discharge: 2013-05-08 | Disposition: A | Discharge status: Home / Self Care | Payer: Medicaid Other | Attending: Emergency Medicine | Admitting: Emergency Medicine

## 2013-05-08 ENCOUNTER — Encounter (HOSPITAL_COMMUNITY): Payer: Self-pay | Admitting: Emergency Medicine

## 2013-05-08 ENCOUNTER — Emergency Department (INDEPENDENT_AMBULATORY_CARE_PROVIDER_SITE_OTHER): Payer: Medicaid Other

## 2013-05-08 ENCOUNTER — Emergency Department (HOSPITAL_COMMUNITY)
Admission: EM | Admit: 2013-05-08 | Discharge: 2013-05-08 | Disposition: A | Discharge status: Home / Self Care | Payer: Medicaid Other | Source: Home / Self Care | Attending: Family Medicine | Admitting: Family Medicine

## 2013-05-08 ENCOUNTER — Encounter (HOSPITAL_COMMUNITY): Payer: Self-pay

## 2013-05-08 DIAGNOSIS — E785 Hyperlipidemia, unspecified: Secondary | ICD-10-CM | POA: Insufficient documentation

## 2013-05-08 DIAGNOSIS — L97519 Non-pressure chronic ulcer of other part of right foot with unspecified severity: Secondary | ICD-10-CM

## 2013-05-08 DIAGNOSIS — Z7982 Long term (current) use of aspirin: Secondary | ICD-10-CM | POA: Insufficient documentation

## 2013-05-08 DIAGNOSIS — I10 Essential (primary) hypertension: Secondary | ICD-10-CM | POA: Insufficient documentation

## 2013-05-08 DIAGNOSIS — L03039 Cellulitis of unspecified toe: Secondary | ICD-10-CM

## 2013-05-08 DIAGNOSIS — L02611 Cutaneous abscess of right foot: Secondary | ICD-10-CM

## 2013-05-08 DIAGNOSIS — I2510 Atherosclerotic heart disease of native coronary artery without angina pectoris: Secondary | ICD-10-CM | POA: Insufficient documentation

## 2013-05-08 DIAGNOSIS — Z79899 Other long term (current) drug therapy: Secondary | ICD-10-CM | POA: Insufficient documentation

## 2013-05-08 DIAGNOSIS — F17200 Nicotine dependence, unspecified, uncomplicated: Secondary | ICD-10-CM | POA: Insufficient documentation

## 2013-05-08 DIAGNOSIS — F3289 Other specified depressive episodes: Secondary | ICD-10-CM | POA: Insufficient documentation

## 2013-05-08 DIAGNOSIS — L02619 Cutaneous abscess of unspecified foot: Secondary | ICD-10-CM

## 2013-05-08 DIAGNOSIS — F329 Major depressive disorder, single episode, unspecified: Secondary | ICD-10-CM | POA: Insufficient documentation

## 2013-05-08 DIAGNOSIS — L97509 Non-pressure chronic ulcer of other part of unspecified foot with unspecified severity: Secondary | ICD-10-CM | POA: Insufficient documentation

## 2013-05-08 DIAGNOSIS — F411 Generalized anxiety disorder: Secondary | ICD-10-CM | POA: Insufficient documentation

## 2013-05-08 DIAGNOSIS — G8929 Other chronic pain: Secondary | ICD-10-CM | POA: Insufficient documentation

## 2013-05-08 DIAGNOSIS — L0390 Cellulitis, unspecified: Secondary | ICD-10-CM

## 2013-05-08 MED ORDER — AMOXICILLIN-POT CLAVULANATE 875-125 MG PO TABS
1.0000 | ORAL_TABLET | Freq: Once | ORAL | Status: AC
  Administered 2013-05-08: 1 via ORAL
  Filled 2013-05-08: qty 1

## 2013-05-08 MED ORDER — AMOXICILLIN-POT CLAVULANATE 875-125 MG PO TABS: 1.0000 | ORAL_TABLET | Freq: Two times a day (BID) | ORAL | Status: DC

## 2013-05-08 NOTE — ED Notes (Signed)
Pt c/o Right 2nd toe pain and redness x2-3 weeks and Left foot pain to his achilous tendon x4-5 weeks. Pt seen at [facility] sent here for f/u to rule out infection in his Right toe. Pt reports he noticed a cllous [place] pulled the callous off and had had increase pain and redness

## 2013-05-08 NOTE — ED Notes (Signed)
Pt c/o 2nd right toe pain and left achilles tendon pain. This has been ongoing for about 3 weeks. Pt states he was swimming in a lake or pool and noticed that toe was calloused and picked at his skin and since then it has been red and inflamed. Pt reports left achilles hurts to flex. Pt is alert and oriented and in no acute distress.

## 2013-05-08 NOTE — ED Provider Notes (Signed)
CSN: [id number]     Arrival date & time 05/08/13  1726 History   First MD Initiated Contact with Patient 05/08/13 1819     Chief Complaint  Patient presents with  . Foot Pain  . Nail Problem    HPI  Leonard [place] a resort about 10 [place]. He was swimming in [place], and swimming in a swimming pool. He knows that the skin on the tip of his right second toe had gotten soft and formed callus. He picked the skin off of his finger. Today or 2 later he noticed he was a bit red on the end. He has chronic bilateral peripheral neuropathy. Usually not noticed pain. Became red. Is not draining. He had no injury or trauma. It extended to the MCP joint. He started putting bacitracin on it has improved and only involved the tip of the toe. He was seen in urgent care. He had an x-ray that does not show osteomyelitis. He was sent here  Past Medical History  Diagnosis Date  . CAD (coronary artery disease) 12/2007    AMI with stenting; retinal embolus 08/2005; stenting 03/2009  . Hyperlipidemia   . Hypertension   . OSA (obstructive sleep apnea)     not using CPAP-feels suffocating  . GAD (generalized anxiety disorder)   . Depression   . Chronic back pain     injuries 1998, 2008, 01/2009  . Peripheral neuropathy   . Hypogonadism male     can't afford testosterone   History reviewed. No pertinent past surgical history. History reviewed. No pertinent family history. History  Substance Use Topics  . Smoking status: Current Every Day Smoker -- 0.25 packs/day    Types: Cigarettes  . Smokeless tobacco: Not on file  . Alcohol Use: 0.6 oz/week    1 Cans of beer per week    Review of Systems  Constitutional: Negative for fever and fatigue.  Skin: Positive for wound.    Allergies  Review of patient's allergies indicates no known allergies.  Home Medications   Current Outpatient Rx  Name  Route  Sig  Dispense  Refill  . ALPRAZolam (XANAX) 1 MG tablet   Oral   Take 1 mg by mouth 3 (three)  times daily as needed. For anxiety         . amitriptyline (ELAVIL) 100 MG tablet   Oral   Take 1 tablet (100 mg total) by mouth at bedtime.   30 tablet   3   . amLODipine (NORVASC) 5 MG tablet   Oral   Take 5 mg by mouth daily.         Marland Kitchen aspirin EC 81 MG tablet   Oral   Take 81 mg by mouth daily.         . B Complex-C (SUPER B COMPLEX PO)   Oral   Take 1 capsule by mouth daily.         . clopidogrel (PLAVIX) 75 MG tablet   Oral   Take 1 tablet (75 mg total) by mouth daily.   30 tablet   3   . fish oil-omega-3 fatty acids 1000 MG capsule   Oral   Take 1 g by mouth daily.         . furosemide (LASIX) 40 MG tablet   Oral   Take 1 tablet (40 mg total) by mouth daily.   60 tablet   3   . isosorbide mononitrate (IMDUR) 60 MG 24 hr tablet  Oral   Take 0.5 tablets (30 mg total) by mouth every morning. PATIENT NEEDS OFFICE VISIT FOR ADDITIONAL REFILLS   15 tablet   0   . lisinopril (PRINIVIL,ZESTRIL) 20 MG tablet   Oral   Take 1 tablet (20 mg total) by mouth daily.   30 tablet   0   . nitroGLYCERIN (NITROSTAT) 0.4 MG SL tablet   Sublingual   Place 1 tablet (0.4 mg total) under the tongue every 5 (five) minutes as needed.   25 tablet   5   . oxyCODONE (OXYCONTIN) 20 MG 12 hr tablet   Oral   Take 20 mg by mouth every 12 (twelve) hours.         Marland Kitchen oxyCODONE (ROXICODONE) 15 MG immediate release tablet   Oral   Take 15 mg by mouth every 4 (four) hours.         Marland Kitchen PARoxetine (PAXIL) 20 MG tablet   Oral   Take 1 tablet (20 mg total) by mouth every morning.   30 tablet   0     PATIENT NEEDS OFFICE VISIT FOR ADDITIONAL REFILLS   . pregabalin (LYRICA) 100 MG capsule   Oral   Take 100 mg by mouth 3 (three) times daily.         . simvastatin (ZOCOR) 40 MG tablet   Oral   Take 1 tablet (40 mg total) by mouth at bedtime. Needs office visit and labs-final notice   7 tablet   0   . amoxicillin-clavulanate (AUGMENTIN) 875-125 MG per tablet    Oral   Take 1 tablet by mouth 2 (two) times daily.   14 tablet   0    BP 124/88  Pulse 89  Temp(Src) 98 F (36.7 C) (Oral)  Resp 18  SpO2 97% Physical Exam  Musculoskeletal:  Redness to the tip of the right second toe. Normal range of motion of the toe. Nonpainful to the dorsum of the foot  Skin:  Small area of granulation of the tip of the right second toe. Some cellulitis and erythema extends onto the dorsum of the proximal phalanx. Does not extend onto the dorsum of the foot in the area of the metatarsal. The foot itself is not swollen    ED Course  Procedures (including critical care time) Labs Review Labs Reviewed - No data to display Imaging Review Dg Foot Complete Right  05/08/2013   CLINICAL DATA:  52 year old male with pain right foot second toe. Abnormal soft tissue.  EXAM: RIGHT FOOT COMPLETE - 3+ VIEW  COMPARISON:  None.  FINDINGS: No subcutaneous gas. Bone mineralization is within normal limits. No fracture or osteolysis identified. Small accessory ossicles adjacent to the navicular and cuboid. Calcaneus appears intact.  IMPRESSION: No acute osseous abnormality identified at the right foot.   Electronically Signed   By: Augusto Gamble M.D.   On: 05/08/2013 16:44    MDM   1. Cellulitis   2. Foot ulcer, right    Likely cellulitis. Unable to express any purulence from the area of granulation. His foot is not compromise is toe itself is not compromised. Plan is to try treatment. Prescription for Augmentin. Given first dose here. Dressing placed to the wound. Given orthopedics if he is not improving in the next 7 days and asked to call for an appointment. Diagnosis cellulitis right second toe without radiographic signs of osteomyelitis    Roney Marion, MD 05/08/13 [phone]

## 2013-05-08 NOTE — ED Provider Notes (Signed)
CSN: [id number]     Arrival date & time 05/08/13  1532 History   None    Chief Complaint  Patient presents with  . Foot Pain   (Consider location/radiation/quality/duration/timing/severity/associated sxs/prior Treatment) Patient is a 52 y.o. male presenting with lower extremity pain. The history is provided by the patient.  Foot Pain This is a new problem. The current episode started more than 1 week ago (3wk h/o sx). The problem has been gradually worsening. Associated symptoms comments: Has neuropathy in feet, , picked callous off of toe and became red and painful and swollen since..    Past Medical History  Diagnosis Date  . CAD (coronary artery disease) 12/2007    AMI with stenting; retinal embolus 08/2005; stenting 03/2009  . Hyperlipidemia   . Hypertension   . OSA (obstructive sleep apnea)     not using CPAP-feels suffocating  . GAD (generalized anxiety disorder)   . Depression   . Chronic back pain     injuries 1998, 2008, 01/2009  . Peripheral neuropathy   . Hypogonadism male     can't afford testosterone   History reviewed. No pertinent past surgical history. History reviewed. No pertinent family history. History  Substance Use Topics  . Smoking status: Current Every Day Smoker -- 0.25 packs/day    Types: Cigarettes  . Smokeless tobacco: Not on file  . Alcohol Use: 0.6 oz/week    1 Cans of beer per week    Review of Systems  Constitutional: Negative.   Musculoskeletal: Positive for joint swelling and gait problem.  Skin: Positive for wound.    Allergies  Review of patient's allergies indicates no known allergies.  Home Medications   Current Outpatient Rx  Name  Route  Sig  Dispense  Refill  . ALPRAZolam (XANAX) 1 MG tablet   Oral   Take 1 mg by mouth 3 (three) times daily as needed. For anxiety         . amitriptyline (ELAVIL) 100 MG tablet   Oral   Take 1 tablet (100 mg total) by mouth at bedtime.   30 tablet   3   . amLODipine (NORVASC) 5 MG  tablet      TAKE ONE (1) TABLET BY MOUTH EVERY DAY   30 tablet   10   . aspirin EC 81 MG tablet   Oral   Take 81 mg by mouth daily.         . B Complex-C (SUPER B COMPLEX PO)   Oral   Take 1 capsule by mouth daily.         . clopidogrel (PLAVIX) 75 MG tablet   Oral   Take 1 tablet (75 mg total) by mouth daily.   30 tablet   3   . fish oil-omega-3 fatty acids 1000 MG capsule   Oral   Take 1 g by mouth daily.         . furosemide (LASIX) 40 MG tablet   Oral   Take 1 tablet (40 mg total) by mouth daily.   60 tablet   3   . isosorbide mononitrate (IMDUR) 60 MG 24 hr tablet   Oral   Take 0.5 tablets (30 mg total) by mouth every morning. PATIENT NEEDS OFFICE VISIT FOR ADDITIONAL REFILLS   15 tablet   0   . lisinopril (PRINIVIL,ZESTRIL) 20 MG tablet   Oral   Take 1 tablet (20 mg total) by mouth daily.   30 tablet   0   .  nitroGLYCERIN (NITROSTAT) 0.4 MG SL tablet   Sublingual   Place 1 tablet (0.4 mg total) under the tongue every 5 (five) minutes as needed.   25 tablet   5   . oxyCODONE (OXYCONTIN) 20 MG 12 hr tablet   Oral   Take 20 mg by mouth every 12 (twelve) hours.         Marland Kitchen oxyCODONE (ROXICODONE) 15 MG immediate release tablet   Oral   Take 15 mg by mouth every 4 (four) hours.         Marland Kitchen PARoxetine (PAXIL) 20 MG tablet   Oral   Take 1 tablet (20 mg total) by mouth every morning.   30 tablet   0     PATIENT NEEDS OFFICE VISIT FOR ADDITIONAL REFILLS   . pregabalin (LYRICA) 75 MG capsule   Oral   Take 1 capsule (75 mg total) by mouth 3 (three) times daily.   90 capsule   0   . simvastatin (ZOCOR) 40 MG tablet   Oral   Take 1 tablet (40 mg total) by mouth at bedtime. Needs office visit and labs-final notice   7 tablet   0   . zolpidem (AMBIEN) 10 MG tablet   Oral   Take 10 mg by mouth at bedtime as needed. For insomnia          BP 128/69  Pulse 90  Temp(Src) 97.8 F (36.6 C) (Oral)  Resp 20  SpO2 95% Physical Exam  Nursing  note and vitals reviewed. Constitutional: He is oriented to person, place, and time. He appears well-developed and well-nourished.  Musculoskeletal: He exhibits tenderness.       Feet:  Neurological: He is alert and oriented to person, place, and time.  Skin: Skin is warm and dry. There is erythema.    ED Course  Procedures (including critical care time) Labs Review Labs Reviewed - No data to display Imaging Review Dg Foot Complete Right  05/08/2013   CLINICAL DATA:  52 year old male with pain right foot second toe. Abnormal soft tissue.  EXAM: RIGHT FOOT COMPLETE - 3+ VIEW  COMPARISON:  None.  FINDINGS: No subcutaneous gas. Bone mineralization is within normal limits. No fracture or osteolysis identified. Small accessory ossicles adjacent to the navicular and cuboid. Calcaneus appears intact.  IMPRESSION: No acute osseous abnormality identified at the right foot.   Electronically Signed   By: Augusto Gamble M.D.   On: 05/08/2013 16:44    MDM  X-rays reviewed and report per radiologist.  Rhina Brackett for iv abx and ortho eval for toe cellulitis / pending osteomyelitis.     Linna Hoff, MD 05/08/13 [phone]

## 2013-05-09 NOTE — ED Notes (Signed)
Pt  Called  Concerned   That    He  Was  Not  Given an injection    While  In the  Er            After  Being  Sent  by  Dr  Artis Flock -  Pt  Notified   To  followup  With  Dr   Shon Baton  Ads  Directed

## 2013-05-11 ENCOUNTER — Encounter (HOSPITAL_COMMUNITY): Payer: Self-pay | Admitting: *Deleted

## 2013-05-11 ENCOUNTER — Emergency Department (HOSPITAL_COMMUNITY): Payer: Medicaid Other

## 2013-05-11 ENCOUNTER — Emergency Department (HOSPITAL_COMMUNITY)
Admission: EM | Admit: 2013-05-11 | Discharge: 2013-05-11 | Disposition: A | Discharge status: Home / Self Care | Payer: Medicaid Other | Attending: Emergency Medicine | Admitting: Emergency Medicine

## 2013-05-11 DIAGNOSIS — Z7982 Long term (current) use of aspirin: Secondary | ICD-10-CM | POA: Insufficient documentation

## 2013-05-11 DIAGNOSIS — Z79899 Other long term (current) drug therapy: Secondary | ICD-10-CM | POA: Insufficient documentation

## 2013-05-11 DIAGNOSIS — I10 Essential (primary) hypertension: Secondary | ICD-10-CM | POA: Insufficient documentation

## 2013-05-11 DIAGNOSIS — L03039 Cellulitis of unspecified toe: Secondary | ICD-10-CM | POA: Insufficient documentation

## 2013-05-11 DIAGNOSIS — E785 Hyperlipidemia, unspecified: Secondary | ICD-10-CM | POA: Insufficient documentation

## 2013-05-11 DIAGNOSIS — F17200 Nicotine dependence, unspecified, uncomplicated: Secondary | ICD-10-CM | POA: Insufficient documentation

## 2013-05-11 DIAGNOSIS — L02619 Cutaneous abscess of unspecified foot: Secondary | ICD-10-CM | POA: Insufficient documentation

## 2013-05-11 DIAGNOSIS — F411 Generalized anxiety disorder: Secondary | ICD-10-CM | POA: Insufficient documentation

## 2013-05-11 DIAGNOSIS — F3289 Other specified depressive episodes: Secondary | ICD-10-CM | POA: Insufficient documentation

## 2013-05-11 DIAGNOSIS — G8929 Other chronic pain: Secondary | ICD-10-CM | POA: Insufficient documentation

## 2013-05-11 DIAGNOSIS — Z8669 Personal history of other diseases of the nervous system and sense organs: Secondary | ICD-10-CM | POA: Insufficient documentation

## 2013-05-11 DIAGNOSIS — F329 Major depressive disorder, single episode, unspecified: Secondary | ICD-10-CM | POA: Insufficient documentation

## 2013-05-11 DIAGNOSIS — I2510 Atherosclerotic heart disease of native coronary artery without angina pectoris: Secondary | ICD-10-CM | POA: Insufficient documentation

## 2013-05-11 LAB — CBC WITH DIFFERENTIAL/PLATELET
Basophils Absolute: 0 10*3/uL (ref 0.0–0.1)
Basophils Relative: 1 % (ref 0–1)
Eosinophils Absolute: 0.2 10*3/uL (ref 0.0–0.7)
Eosinophils Relative: 3 % (ref 0–5)
HCT: 46.2 % (ref 39.0–52.0)
Hemoglobin: 16.4 g/dL (ref 13.0–17.0)
Lymphs Abs: 2.3 10*3/uL (ref 0.7–4.0)
MCH: 32.5 pg (ref 26.0–34.0)
MCHC: 35.5 g/dL (ref 30.0–36.0)
Monocytes Absolute: 0.7 10*3/uL (ref 0.1–1.0)
Neutro Abs: 4 10*3/uL (ref 1.7–7.7)
RBC: 5.04 MIL/uL (ref 4.22–5.81)
RDW: 13.6 % (ref 11.5–15.5)

## 2013-05-11 MED ORDER — LIDOCAINE HCL (PF) 1 % IJ SOLN
1.0000 mL | Freq: Once | INTRAMUSCULAR | Status: DC
  Filled 2013-05-11: qty 5

## 2013-05-11 MED ORDER — CEFTRIAXONE SODIUM 1 G IJ SOLR
1.0000 g | Freq: Once | INTRAMUSCULAR | Status: AC
  Administered 2013-05-11: 1 g via INTRAMUSCULAR
  Filled 2013-05-11: qty 10

## 2013-05-11 MED ORDER — LIDOCAINE HCL (PF) 1 % IJ SOLN
2.1000 mL | Freq: Once | INTRAMUSCULAR | Status: AC
  Administered 2013-05-11: 2.1 mL
  Filled 2013-05-11: qty 5

## 2013-05-11 MED ORDER — SULFAMETHOXAZOLE-TRIMETHOPRIM 800-160 MG PO TABS: 1.0000 | ORAL_TABLET | Freq: Two times a day (BID) | ORAL | Status: DC

## 2013-05-11 NOTE — ED Notes (Signed)
Monday pt was tx in ED for cellulitis of the 2 nd toe to R foot.  He was told by [facility] doctor that he was going to be given a shot in the ED, but he was only given pills.  He is still taking the Augmentin, but he feels he still should have gotten "the shot" because he has peripheral neuropathy.  2nd toe appears to be healing.  Pt talks constantly.

## 2013-05-11 NOTE — ED Provider Notes (Signed)
CSN: [id number]     Arrival date & time 05/11/13  1557 History  This chart was scribed for non-physician practitioner Wynetta Emery, PA-C working with Gwyneth Sprout, MD by Danella Maiers, ED Scribe. This patient was seen in room TR08C/TR08C and the patient's care was started at 4:45 PM.    Chief Complaint  Patient presents with  . Toe Pain   Patient is a 52 y.o. male presenting with toe pain. The history is provided by the patient. No language interpreter was used.  Toe Pain   HPI Comments: Leonard Murray is a 52 y.o. male past medical history significant for vasculopathy and chronic bilateral peripheral neuropathy who presents to the Emergency Department complaining of right second toe pain from a callous the pt "picked at" onset 2 weeks ago. He was seen in the ER three days ago for the same complaint after being told by Urgent Care to come here for an antibiotic injection and xray. The xray was normal and pt was given Augmentin. Pt reports he is compliant with the Augmentin but still wants the "injection". Patient denies spreading redness, streaking, fever, chills, nausea vomiting. States he does not have a primary care Dr.  Past Medical History  Diagnosis Date  . CAD (coronary artery disease) 12/2007    AMI with stenting; retinal embolus 08/2005; stenting 03/2009  . Hyperlipidemia   . Hypertension   . OSA (obstructive sleep apnea)     not using CPAP-feels suffocating  . GAD (generalized anxiety disorder)   . Depression   . Chronic back pain     injuries 1998, 2008, 01/2009  . Peripheral neuropathy   . Hypogonadism male     can't afford testosterone   History reviewed. No pertinent past surgical history. No family history on file. History  Substance Use Topics  . Smoking status: Current Every Day Smoker -- 0.25 packs/day    Types: Cigarettes  . Smokeless tobacco: Not on file  . Alcohol Use: 0.6 oz/week    1 Cans of beer per week    Review of Systems A complete 10 system  review of systems was obtained and all systems are negative except as noted in the HPI and PMH.   Allergies  Review of patient's allergies indicates no known allergies.  Home Medications   Current Outpatient Rx  Name  Route  Sig  Dispense  Refill  . ALPRAZolam (XANAX) 1 MG tablet   Oral   Take 1 mg by mouth 3 (three) times daily as needed. For anxiety         . amitriptyline (ELAVIL) 100 MG tablet   Oral   Take 100 mg by mouth at bedtime.         Marland Kitchen amLODipine (NORVASC) 5 MG tablet   Oral   Take 5 mg by mouth daily.         Marland Kitchen amoxicillin-clavulanate (AUGMENTIN) 875-125 MG per tablet   Oral   Take 1 tablet by mouth 2 (two) times daily.         Marland Kitchen aspirin EC 81 MG tablet   Oral   Take 81 mg by mouth daily.         . B Complex-C (SUPER B COMPLEX PO)   Oral   Take 1 capsule by mouth daily.         . clopidogrel (PLAVIX) 75 MG tablet   Oral   Take 75 mg by mouth daily.         . furosemide (LASIX) 40  MG tablet   Oral   Take 20-40 mg by mouth daily as needed for fluid.         . isosorbide mononitrate (IMDUR) 60 MG 24 hr tablet   Oral   Take 30 mg by mouth daily.         Marland Kitchen lisinopril (PRINIVIL,ZESTRIL) 20 MG tablet   Oral   Take 20 mg by mouth daily.         . nitroGLYCERIN (NITROSTAT) 0.4 MG SL tablet   Sublingual   Place 0.4 mg under the tongue every 5 (five) minutes as needed for chest pain.         Marland Kitchen oxyCODONE (OXYCONTIN) 20 MG 12 hr tablet   Oral   Take 20 mg by mouth every 12 (twelve) hours.         Marland Kitchen oxyCODONE (ROXICODONE) 15 MG immediate release tablet   Oral   Take 15 mg by mouth every 4 (four) hours.         Marland Kitchen PARoxetine (PAXIL) 20 MG tablet   Oral   Take 20 mg by mouth every morning.         . pregabalin (LYRICA) 100 MG capsule   Oral   Take 100 mg by mouth 3 (three) times daily.         . simvastatin (ZOCOR) 40 MG tablet   Oral   Take 40 mg by mouth every evening.          BP 156/72  Pulse 87  Temp(Src)  98.6 F (37 C) (Oral)  Resp 20  SpO2 95% Physical Exam  Nursing note and vitals reviewed. Constitutional: He is oriented to person, place, and time. He appears well-developed and well-nourished. No distress.  HENT:  Head: Normocephalic.  Eyes: Conjunctivae and EOM are normal. Pupils are equal, round, and reactive to light.  Neck: Normal range of motion.  Cardiovascular: Normal rate.   Pulmonary/Chest: Effort normal and breath sounds normal. No stridor. No respiratory distress. He has no wheezes. He has no rales. He exhibits no tenderness.  Abdominal: Soft. Bowel sounds are normal. He exhibits no distension and no mass. There is no tenderness. There is no rebound and no guarding.  Musculoskeletal: Normal range of motion.  Neurological: He is alert and oriented to person, place, and time.  Skin:  Erythema and warmth to right second digit, there is a small ulceration at the distal tip. Reduced range of motion secondary to pain. Probing the ulcer does not produce contact with bone: it is partial thickness.  Psychiatric: He has a normal mood and affect.    ED Course  Procedures (including critical care time) Medications - No data to display  DIAGNOSTIC STUDIES: Oxygen Saturation is 95% on room air, normal by my interpretation.    COORDINATION OF CARE: 5:53 PM- Discussed treatment plan with pt which includes antibiotic injection and pt agrees to plan.    Labs Review Labs Reviewed  CBC WITH DIFFERENTIAL  GLUCOSE, CAPILLARY   Imaging Review Dg Toe 2nd Right  05/11/2013   *RADIOLOGY REPORT*  Clinical Data: Right second toe pain, redness and swelling.  RIGTH SECOND TOE  Comparison: None.  Findings: No evidence of acute fracture, subluxation or dislocation identified.  No radio-opaque foreign bodies are present.  No focal bony lesions are noted.  The joint spaces are unremarkable.  IMPRESSION: No acute bony abnormalities.   Original Report Authenticated By: Harmon Pier, M.D.    MDM    1. Cellulitis, toe, right  Filed Vitals:   05/11/13 1615 05/11/13 1942  BP: 156/72 147/76  Pulse: 87 91  Temp: 98.6 F (37 C) 98.7 F (37.1 C)  TempSrc: Oral   Resp: 20 18  SpO2: 95% 99%     Leonard Murray is a 52 y.o. male with cellulitis to right second digit, he has had 3 days of Augmentin. He does not state that the area of redness is spreading, this is not a failure of outpatient treatment. He is concerned about osteomyelitis. He is a nondiabetic but a vasculopath. He is also a smoker. Exam is not grossly concerning for osteomyelitis. CBC shows no leukocytosis and CBG is normal. X-rays also not consistent. Patient has Augmentin I will also start him on Bactrim. We have discussed return precautions.   Medications  cefTRIAXone (ROCEPHIN) injection 1 g (1 g Intramuscular Given 05/11/13 1823)  lidocaine (PF) (XYLOCAINE) 1 % injection 2.1 mL (2.1 mLs Other Given 05/11/13 1824)    Pt is hemodynamically stable, appropriate for, and amenable to discharge at this time. Pt verbalized understanding and agrees with care plan. All questions answered. Outpatient follow-up and specific return precautions discussed.    Discharge Medication List as of 05/11/2013  7:19 PM    START taking these medications   Details  sulfamethoxazole-trimethoprim (SEPTRA DS) 800-160 MG per tablet Take 1 tablet by mouth every 12 (twelve) hours., Starting 05/11/2013, Until Discontinued, Print        Note: Portions of this report may have been transcribed using voice recognition software. Every effort was made to ensure accuracy; however, inadvertent computerized transcription errors may be present    Wynetta Emery, PA-C 05/12/13 0056

## 2013-05-11 NOTE — ED Notes (Signed)
Patient transported to X-ray 

## 2013-05-12 NOTE — ED Provider Notes (Signed)
Medical screening examination/treatment/procedure(s) were performed by non-physician practitioner and as supervising physician I was immediately available for consultation/collaboration.   Gwyneth Sprout, MD 05/12/13 [phone]

## 2013-05-24 ENCOUNTER — Telehealth: Payer: Self-pay | Admitting: General Practice

## 2013-05-24 ENCOUNTER — Other Ambulatory Visit: Payer: Self-pay | Admitting: Internal Medicine

## 2013-05-24 ENCOUNTER — Ambulatory Visit: Payer: Medicaid Other | Attending: Internal Medicine

## 2013-05-24 DIAGNOSIS — M79673 Pain in unspecified foot: Secondary | ICD-10-CM

## 2013-05-24 DIAGNOSIS — Z23 Encounter for immunization: Secondary | ICD-10-CM

## 2013-05-24 NOTE — Progress Notes (Unsigned)
Pt here for flu vaccine.

## 2013-05-24 NOTE — Telephone Encounter (Signed)
Patient came in on Friday. Dr. Elisabeth Pigeon wrote him referrals for psych, an orthopedic surgeon, and podiatrist. Pt. Would like you to call him back with the appointments. He would like to go to [facility] per the ed. He would like to go to Dr. Jannifer Franklin [phone]) for his pysch appt. He would like to see Dr. Marlowe Aschoff  [phone]) for the podiatrist.

## 2013-05-29 NOTE — Telephone Encounter (Signed)
I already talk to Leonard Murray about his referral

## 2013-05-30 ENCOUNTER — Encounter (HOSPITAL_COMMUNITY): Payer: Self-pay | Admitting: Emergency Medicine

## 2013-05-30 ENCOUNTER — Emergency Department (HOSPITAL_COMMUNITY): Payer: Medicaid Other

## 2013-05-30 ENCOUNTER — Emergency Department (HOSPITAL_COMMUNITY)
Admission: EM | Admit: 2013-05-30 | Discharge: 2013-05-30 | Disposition: A | Discharge status: Home / Self Care | Payer: Medicaid Other | Attending: Emergency Medicine | Admitting: Emergency Medicine

## 2013-05-30 DIAGNOSIS — I10 Essential (primary) hypertension: Secondary | ICD-10-CM | POA: Insufficient documentation

## 2013-05-30 DIAGNOSIS — Z8669 Personal history of other diseases of the nervous system and sense organs: Secondary | ICD-10-CM | POA: Insufficient documentation

## 2013-05-30 DIAGNOSIS — Z792 Long term (current) use of antibiotics: Secondary | ICD-10-CM | POA: Insufficient documentation

## 2013-05-30 DIAGNOSIS — G8929 Other chronic pain: Secondary | ICD-10-CM | POA: Insufficient documentation

## 2013-05-30 DIAGNOSIS — M7989 Other specified soft tissue disorders: Secondary | ICD-10-CM | POA: Insufficient documentation

## 2013-05-30 DIAGNOSIS — Z79899 Other long term (current) drug therapy: Secondary | ICD-10-CM | POA: Insufficient documentation

## 2013-05-30 DIAGNOSIS — Z0271 Encounter for disability determination: Secondary | ICD-10-CM

## 2013-05-30 DIAGNOSIS — E785 Hyperlipidemia, unspecified: Secondary | ICD-10-CM | POA: Insufficient documentation

## 2013-05-30 DIAGNOSIS — F3289 Other specified depressive episodes: Secondary | ICD-10-CM | POA: Insufficient documentation

## 2013-05-30 DIAGNOSIS — F411 Generalized anxiety disorder: Secondary | ICD-10-CM | POA: Insufficient documentation

## 2013-05-30 DIAGNOSIS — F17200 Nicotine dependence, unspecified, uncomplicated: Secondary | ICD-10-CM | POA: Insufficient documentation

## 2013-05-30 DIAGNOSIS — I2510 Atherosclerotic heart disease of native coronary artery without angina pectoris: Secondary | ICD-10-CM | POA: Insufficient documentation

## 2013-05-30 DIAGNOSIS — Z7982 Long term (current) use of aspirin: Secondary | ICD-10-CM | POA: Insufficient documentation

## 2013-05-30 DIAGNOSIS — F329 Major depressive disorder, single episode, unspecified: Secondary | ICD-10-CM | POA: Insufficient documentation

## 2013-05-30 LAB — GLUCOSE, CAPILLARY: Glucose-Capillary: 98 mg/dL (ref 70–99)

## 2013-05-30 MED ORDER — DOXYCYCLINE HYCLATE 100 MG PO CAPS: 100.0000 mg | ORAL_CAPSULE | Freq: Two times a day (BID) | ORAL | Status: DC

## 2013-05-30 NOTE — ED Notes (Signed)
Pt in c/o redness and swelling to right second toe, pt states he has been seen and treated with antibiotics for same, completed antibiotics but redness and swelling continues, pt is concerned due to being a diabetic

## 2013-05-30 NOTE — ED Notes (Signed)
Patient transported to X-ray 
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
## 2013-05-30 NOTE — Discharge Instructions (Signed)
Your xray shows no bone infection, start doxycycline twice daily.

## 2013-06-07 ENCOUNTER — Other Ambulatory Visit: Payer: Self-pay

## 2013-06-07 MED ORDER — LISINOPRIL 20 MG PO TABS: ORAL_TABLET | ORAL | Status: DC

## 2013-06-07 NOTE — Telephone Encounter (Signed)
Rx was sent to pharmacy electronically. 

## 2013-06-20 ENCOUNTER — Encounter: Payer: Self-pay | Admitting: Podiatry

## 2013-06-20 ENCOUNTER — Ambulatory Visit (INDEPENDENT_AMBULATORY_CARE_PROVIDER_SITE_OTHER): Payer: Medicaid Other | Admitting: Podiatry

## 2013-06-20 VITALS — BP 113/65 | HR 83 | Temp 98.6°F | Resp 12 | Ht 70.5 in | Wt 240.0 lb

## 2013-06-20 DIAGNOSIS — L97511 Non-pressure chronic ulcer of other part of right foot limited to breakdown of skin: Secondary | ICD-10-CM

## 2013-06-20 DIAGNOSIS — L97509 Non-pressure chronic ulcer of other part of unspecified foot with unspecified severity: Secondary | ICD-10-CM

## 2013-06-20 MED ORDER — LEVOFLOXACIN 750 MG PO TABS: 750.0000 mg | ORAL_TABLET | Freq: Every day | ORAL | Status: DC

## 2013-06-20 MED ORDER — CLINDAMYCIN HCL 150 MG PO CAPS: 150.0000 mg | ORAL_CAPSULE | Freq: Three times a day (TID) | ORAL | Status: DC
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
## 2013-06-20 NOTE — Progress Notes (Signed)
N  Pheripheral Neuropathy, red, swollen, drainage L  Ulcer 2nd rt distal D  Couple mos. O  Suddenly, I peeled callus off too much C  Gotten little better A  Shoes, walking/standing long periods T Bacitracin, Doxycycline, ER x2, [facility]

## 2013-06-24 ENCOUNTER — Telehealth: Payer: Self-pay | Admitting: *Deleted

## 2013-06-24 NOTE — Telephone Encounter (Signed)
Pt complains of severe joint pain, he feels is related to one of the antibiotics he was given by Dr Al Corpus.  Pt states he can hardly walk.

## 2013-06-25 NOTE — Telephone Encounter (Signed)
Inform Leonard Murray that this can sometimes happen with Levaquin.  I would suggest that he stop LEVAQUIN and follow up with me in the near future.

## 2013-06-25 NOTE — Telephone Encounter (Signed)
Dr Al Corpus states to stop the Levaquin and continue the Clindamycin, call if changes or concerns.

## 2013-06-28 ENCOUNTER — Ambulatory Visit: Payer: Medicaid Other

## 2013-07-02 ENCOUNTER — Other Ambulatory Visit: Payer: Self-pay | Admitting: Cardiovascular Disease

## 2013-07-02 NOTE — Telephone Encounter (Signed)
Rx was sent to pharmacy electronically. 

## 2013-07-03 ENCOUNTER — Ambulatory Visit (INDEPENDENT_AMBULATORY_CARE_PROVIDER_SITE_OTHER): Payer: Medicare Other | Admitting: Cardiovascular Disease

## 2013-07-03 ENCOUNTER — Telehealth: Payer: Self-pay | Admitting: Cardiovascular Disease

## 2013-07-03 ENCOUNTER — Encounter: Payer: Self-pay | Admitting: Cardiovascular Disease

## 2013-07-03 VITALS — BP 138/90 | HR 84 | Ht 70.5 in | Wt 238.3 lb

## 2013-07-03 DIAGNOSIS — R5383 Other fatigue: Secondary | ICD-10-CM

## 2013-07-03 DIAGNOSIS — Z79899 Other long term (current) drug therapy: Secondary | ICD-10-CM

## 2013-07-03 DIAGNOSIS — I2510 Atherosclerotic heart disease of native coronary artery without angina pectoris: Secondary | ICD-10-CM

## 2013-07-03 DIAGNOSIS — G629 Polyneuropathy, unspecified: Secondary | ICD-10-CM

## 2013-07-03 DIAGNOSIS — G8929 Other chronic pain: Secondary | ICD-10-CM

## 2013-07-03 DIAGNOSIS — R5381 Other malaise: Secondary | ICD-10-CM

## 2013-07-03 DIAGNOSIS — E785 Hyperlipidemia, unspecified: Secondary | ICD-10-CM

## 2013-07-03 DIAGNOSIS — G609 Hereditary and idiopathic neuropathy, unspecified: Secondary | ICD-10-CM

## 2013-07-03 MED ORDER — ATORVASTATIN CALCIUM 40 MG PO TABS: 40.0000 mg | ORAL_TABLET | Freq: Every day | ORAL | Status: DC

## 2013-07-03 MED ORDER — NEBIVOLOL HCL 5 MG PO TABS: 5.0000 mg | ORAL_TABLET | Freq: Every day | ORAL | Status: DC

## 2013-07-03 NOTE — Telephone Encounter (Signed)
Forward to Dr Kelly 

## 2013-07-03 NOTE — Telephone Encounter (Signed)
Here today and saw Dr Tresa Endo.  Forgot to tell Dr Tresa Endo about swelling of feet for several weeks  Please call.

## 2013-07-03 NOTE — Patient Instructions (Addendum)
Your physician recommends that you schedule a follow-up appointment in: 3 months   Your physician has recommended you make the following change in your medication: Stop Simvastatin 40 mg and Start Atorvastatin 40 mg  Your physician recommends that you return for lab work in: Fasting Blood work CMP,CBC,LIPIDS

## 2013-07-04 ENCOUNTER — Telehealth: Payer: Self-pay | Admitting: Cardiovascular Disease

## 2013-07-04 NOTE — Telephone Encounter (Signed)
Returned call and pt verified x 2.  Pt informed message received.  Informed Rxs were sent to [facility] yesterday.  Pt stated that is fine and he can pick them up there.  RN asked if he wanted them sent to another pharmacy and pt declined.    Pt also c/o BLE edema.  Stated he was put on HCTZ and it didn't work so they switched him to furosemide.  Pt stated his girlfriend told him he should tell his cardiologist about the swelling.  Pt informed Dr. Tresa Endo will be notified and advised to contact PCP or MD that prescribed furosemide for possible adjustments.  Pt verbalized understanding.  Stated he isn't sure who prescribed it.  Pt informed he can look on his prescription bottle and find the prescriber.  Pt verbalized understanding and agreed w/ plan.  Message forwarded to Dr. Tresa Endo.

## 2013-07-04 NOTE — Telephone Encounter (Signed)
Saw Dr Tresa Endo yesterday,said he was going to change his cholesterol medicine.Just called his pharmacy a few minutes ago and still it was not there.

## 2013-07-09 ENCOUNTER — Telehealth: Payer: Self-pay | Admitting: *Deleted

## 2013-07-09 NOTE — Telephone Encounter (Signed)
Pt states he needs to change his 07/16/2013 appt.  I referred to scheduler.

## 2013-07-16 ENCOUNTER — Ambulatory Visit: Payer: Medicaid Other | Admitting: Podiatry

## 2013-07-18 ENCOUNTER — Encounter: Payer: Self-pay | Admitting: Internal Medicine

## 2013-07-18 ENCOUNTER — Ambulatory Visit: Payer: Medicaid Other | Attending: Internal Medicine | Admitting: Internal Medicine

## 2013-07-18 ENCOUNTER — Encounter: Payer: Self-pay | Admitting: Cardiovascular Disease

## 2013-07-18 VITALS — BP 89/57 | HR 89 | Temp 97.8°F | Resp 14 | Ht 67.0 in | Wt 243.6 lb

## 2013-07-18 DIAGNOSIS — E785 Hyperlipidemia, unspecified: Secondary | ICD-10-CM

## 2013-07-18 DIAGNOSIS — I2510 Atherosclerotic heart disease of native coronary artery without angina pectoris: Secondary | ICD-10-CM

## 2013-07-18 DIAGNOSIS — R109 Unspecified abdominal pain: Secondary | ICD-10-CM | POA: Insufficient documentation

## 2013-07-18 DIAGNOSIS — I10 Essential (primary) hypertension: Secondary | ICD-10-CM | POA: Insufficient documentation

## 2013-07-18 DIAGNOSIS — F411 Generalized anxiety disorder: Secondary | ICD-10-CM

## 2013-07-18 LAB — HEPATIC FUNCTION PANEL
ALT: 31 U/L (ref 0–53)
Albumin: 4.3 g/dL (ref 3.5–5.2)
Bilirubin, Direct: 0.1 mg/dL (ref 0.0–0.3)
Indirect Bilirubin: 0.3 mg/dL (ref 0.0–0.9)
Total Bilirubin: 0.4 mg/dL (ref 0.3–1.2)

## 2013-07-18 MED ORDER — PANTOPRAZOLE SODIUM 40 MG PO TBEC: 40.0000 mg | DELAYED_RELEASE_TABLET | Freq: Every day | ORAL | Status: DC

## 2013-07-18 NOTE — Patient Instructions (Signed)

## 2013-07-18 NOTE — Progress Notes (Signed)
Patient ID: Leonard Murray, male   DOB: 09/07/1960, 52 y.o.   MRN: [id number]  CC: follow up, abdominal pain   HPI: 32 year ofd male with past medical history of coronary artery disease, hypertension, dyslipidemia, diabetes who presented to clinic with complaints of right upper quadrant abdominal pain started one to 2 days prior to this visit. Patient reports sort of a tingling sensation in the right upper quadrant, on and off, but the worst pain is 8/10 in intensity and resolves on its own. It causes him to sweat during the episodes of pain. No nausea or vomiting. No fevers or chills.  No Known Allergies Past Medical History  Diagnosis Date  . CAD (coronary artery disease) 12/2007    AMI with stenting; retinal embolus 08/2005; stenting 03/2009  . Hyperlipidemia   . Hypertension   . OSA (obstructive sleep apnea)     not using CPAP-feels suffocating  . GAD (generalized anxiety disorder)   . Depression   . Chronic back pain     injuries 1998, 2008, 01/2009  . Peripheral neuropathy   . Hypogonadism male     can't afford testosterone   Current Outpatient Prescriptions on File Prior to Visit  Medication Sig Dispense Refill  . ALPRAZolam (XANAX) 1 MG tablet Take 1 mg by mouth 3 (three) times daily as needed. For anxiety      . amitriptyline (ELAVIL) 100 MG tablet Take 100 mg by mouth at bedtime.      Marland Kitchen amLODipine (NORVASC) 5 MG tablet Take 5 mg by mouth daily.      Marland Kitchen aspirin EC 81 MG tablet Take 81 mg by mouth daily.      Marland Kitchen atorvastatin (LIPITOR) 40 MG tablet Take 1 tablet (40 mg total) by mouth daily.  30 tablet  6  . clopidogrel (PLAVIX) 75 MG tablet Take 75 mg by mouth daily.      . furosemide (LASIX) 40 MG tablet Take 40 mg by mouth daily as needed (swlling).       . isosorbide mononitrate (IMDUR) 60 MG 24 hr tablet Take 30 mg by mouth daily.      Marland Kitchen lisinopril (PRINIVIL,ZESTRIL) 20 MG tablet TAKE ONE (1) TABLET BY MOUTH EVERY DAY (MUST KEEP APPOINTMENT)  30 tablet  3  . nebivolol  (BYSTOLIC) 5 MG tablet Take 1 tablet (5 mg total) by mouth daily.  30 tablet  6  . nitroGLYCERIN (NITROSTAT) 0.4 MG SL tablet Place 0.4 mg under the tongue every 5 (five) minutes as needed for chest pain.      Marland Kitchen oxyCODONE (OXYCONTIN) 20 MG 12 hr tablet Take 20 mg by mouth every 12 (twelve) hours.      Marland Kitchen oxyCODONE (ROXICODONE) 15 MG immediate release tablet Take 15 mg by mouth every 4 (four) hours.      Marland Kitchen PARoxetine (PAXIL) 20 MG tablet Take 20 mg by mouth every morning.      . pregabalin (LYRICA) 100 MG capsule Take 100 mg by mouth 3 (three) times daily.      . clindamycin (CLEOCIN) 150 MG capsule Take 1 capsule (150 mg total) by mouth 3 (three) times daily.  30 capsule  1  . HYDROCHLOROTHIAZIDE PO Take 1 tablet by mouth once.       No current facility-administered medications on file prior to visit.   Diabetes in family.   History   Social History  . Marital Status: Divorced    Spouse Name: n/a    Number of Children: 1  .  Years of Education: 11   Occupational History  . 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 Smoking status: Heavy Tobacco Smoker -- 1.50 packs/day    Types: Cigarettes  . Smokeless tobacco: Never Used  . Alcohol Use: 0.6 oz/week    1 Cans of beer per week     Comment: every now and again  . Drug Use: No  . Sexual Activity: Yes    Partners: Female   Other Topics Concern  . Not on file   Social History Narrative   Lives with girlfriend.  Adult son with history of substance abuse.  Has applied for disability.    Review of Systems  Constitutional: Negative for fever, chills, diaphoresis, activity change, appetite change and fatigue.  HENT: Negative for ear pain, nosebleeds, congestion, facial swelling, rhinorrhea, neck pain, neck stiffness and ear discharge.   Eyes: Negative for pain, discharge, redness, itching and visual disturbance.  Respiratory: Negative for cough, choking, chest tightness, shortness of breath,  wheezing and stridor.   Cardiovascular: Negative for chest pain, palpitations and leg swelling.  Gastrointestinal: Negative for abdominal distention.  Genitourinary: Negative for dysuria, urgency, frequency, hematuria, flank pain, decreased urine volume, difficulty urinating and dyspareunia.  Musculoskeletal: Negative for back pain, joint swelling, arthralgias and gait problem.  Neurological: Negative for dizziness, tremors, seizures, syncope, facial asymmetry, speech difficulty, weakness, light-headedness, numbness and headaches.  Hematological: Negative for adenopathy. Does not bruise/bleed easily.  Psychiatric/Behavioral: Negative for hallucinations, behavioral problems, confusion, dysphoric mood, decreased concentration and agitation.    Objective:   Filed Vitals:   07/18/13 1453  BP: 89/57  Pulse: 89  Temp: 97.8 F (36.6 C)  Resp: 14    Physical Exam  Constitutional: Appears well-developed and well-nourished. No distress.  HENT: Normocephalic. External right and left ear normal. Oropharynx is clear and moist.  Eyes: Conjunctivae and EOM are normal. PERRLA, no scleral icterus.  Neck: Normal ROM. Neck supple. No JVD. No tracheal deviation. No thyromegaly.  CVS: RRR, S1/S2 +, no murmurs, no gallops, no carotid bruit.  Pulmonary: Effort and breath sounds normal, no stridor, rhonchi, wheezes, rales.  Abdominal: Soft. BS +,  no distension, RUQ abdominal tenderness, no rebound or guarding.  Musculoskeletal: Normal range of motion. LE +1 edema and tenderness.  Lymphadenopathy: No lymphadenopathy noted, cervical, inguinal. Neuro: Alert. Normal reflexes, muscle tone coordination. No cranial nerve deficit. Skin: Skin is warm and dry. No rash noted. Not diaphoretic. No erythema. No pallor.  Psychiatric: Normal mood and affect. Behavior, judgment, thought content normal.   Lab Results  Component Value Date   WBC 7.3 05/11/2013   HGB 16.4 05/11/2013   HCT 46.2 05/11/2013   MCV 91.7  05/11/2013   PLT 212 05/11/2013   Lab Results  Component Value Date   CREATININE 0.78 03/28/2013   BUN 13 03/28/2013   NA 140 03/28/2013   K 4.9 03/28/2013   CL 104 03/28/2013   CO2 29 03/28/2013    Lab Results  Component Value Date   HGBA1C 5.5 09/25/2012   Lipid Panel     Component Value Date/Time   CHOL 168 09/25/2012 1611   TRIG 105 09/25/2012 1611   HDL 47 09/25/2012 1611   CHOLHDL 3.6 09/25/2012 1611   VLDL 21 09/25/2012 1611   LDLCALC 100* 09/25/2012 1611       Assessment and plan:   Patient Active Problem List   Diagnosis Date Noted  . Abdominal  pain,  other specified site 07/18/2013    Priority: High - in the RUQ - check abd [place] and LTF's  . Unspecified essential hypertension 01/04/2013    Priority: Medium - We have discussed target BP range - I have advised pt to check BP regularly and to call us back if the numbers are higher than 140/90 - discussed the importance of compliance with medical therapy and diet  - continue same home meds; Norvasc, Lasix, Imdur, lisinopril and Bystolic  . CAD (coronary artery disease)     Priority: Medium - Continue aspirin and   . Hyperlipidemia     Priority: Medium  . GAD (generalized anxiety disorder)     Priority: Medium - continue xanax

## 2013-07-18 NOTE — Progress Notes (Signed)
Patient ID: Leonard Murray, male   DOB: [birth year], 52 y.o.   MRN: [id number]    HPI: Leonard Murray, is a 52 y.o. male who presents to the office today for a 6 month followup cardiologicevaluation.   Mr. Taubman has established coronary artery disease in May 2009 underwent stenting of his mid and distal right coronary artery. His last catheterization was in August 2010 which revealed patent stents. He had mild 20% stenosis in the LAD and mild 10-20% proximal and distal RCA stenoses. He also had an area PLA spasm which did improve with IC nitroglycerin administration. The patient has a history of ongoing tobacco use but is really trying hard to quit. He started smoking at age 39. Presently he is still smoking one half pack per day. He does have a history of significant peripheral neuropathy. He also has a history of hypertension, anxiety, mixed hyperlipidemia, and chronic pain syndrome. Apparently he had been hospitalized also for possible dehydration several months ago.  Apparently he stopped taking his Bystolic at that time.  He does have chronic pain involving his feet back neck shoulders and is undergoing pain management for which he is on OxyContin and oxycodone. He also is on Paxil and Lyrica. Blood pressure perspective he most recently has been taking Lasix 40 mg as needed lisinopril 20 mg amlodipine 5 mg.  Laboratory in August showed a glucose of 126.  Past Medical History  Diagnosis Date  . CAD (coronary artery disease) 12/2007    AMI with stenting; retinal embolus 08/2005; stenting 03/2009  . Hyperlipidemia   . Hypertension   . OSA (obstructive sleep apnea)     not using CPAP-feels suffocating  . GAD (generalized anxiety disorder)   . Depression   . Chronic back pain     injuries 1998, 2008, 01/2009  . Peripheral neuropathy   . Hypogonadism male     can't afford testosterone    Past Surgical History  Procedure Laterality Date  . Bony pelvis surgery    . Bladder repair    . Fracture  surgery    . Cardiac valve replacement      No Known Allergies  Current Outpatient Prescriptions  Medication Sig Dispense Refill  . ALPRAZolam (XANAX) 1 MG tablet Take 1 mg by mouth 3 (three) times daily as needed. For anxiety      . amLODipine (NORVASC) 5 MG tablet Take 5 mg by mouth daily.      Marland Kitchen aspirin EC 81 MG tablet Take 81 mg by mouth daily.      . clindamycin (CLEOCIN) 150 MG capsule Take 1 capsule (150 mg total) by mouth 3 (three) times daily.  30 capsule  1  . clopidogrel (PLAVIX) 75 MG tablet Take 75 mg by mouth daily.      . furosemide (LASIX) 40 MG tablet Take 40 mg by mouth daily as needed (swlling).       Marland Kitchen HYDROCHLOROTHIAZIDE PO Take 1 tablet by mouth once.      . isosorbide mononitrate (IMDUR) 60 MG 24 hr tablet Take 30 mg by mouth daily.      Marland Kitchen lisinopril (PRINIVIL,ZESTRIL) 20 MG tablet TAKE ONE (1) TABLET BY MOUTH EVERY DAY (MUST KEEP APPOINTMENT)  30 tablet  3  . nitroGLYCERIN (NITROSTAT) 0.4 MG SL tablet Place 0.4 mg under the tongue every 5 (five) minutes as needed for chest pain.      Marland Kitchen oxyCODONE (OXYCONTIN) 20 MG 12 hr tablet Take 20 mg by mouth every 12 (twelve) hours.      Marland Kitchen  oxyCODONE (ROXICODONE) 15 MG immediate release tablet Take 15 mg by mouth every 4 (four) hours.      Marland Kitchen PARoxetine (PAXIL) 20 MG tablet Take 20 mg by mouth every morning.      . pregabalin (LYRICA) 100 MG capsule Take 100 mg by mouth 3 (three) times daily.      Marland Kitchen amitriptyline (ELAVIL) 100 MG tablet Take 100 mg by mouth at bedtime.      Marland Kitchen atorvastatin (LIPITOR) 40 MG tablet Take 1 tablet (40 mg total) by mouth daily.  30 tablet  6  . nebivolol (BYSTOLIC) 5 MG tablet Take 1 tablet (5 mg total) by mouth daily.  30 tablet  6  . pantoprazole (PROTONIX) 40 MG tablet Take 1 tablet (40 mg total) by mouth daily.  30 tablet  3   No current facility-administered medications for this visit.    Socially, he is divorced. He has one child. He still smoking one half pack per day of cigarettes. He does  admit to chronic pain. He does drink occasional beer.  ROV is negative for fever chills night sweats. He does wear glasses. He denies skin rash. He does note some mild shortness of breath with activity. He denies recent chest pressure. His leg swelling did improve with reduction of his amlodipine dose institution of low-dose as needed HCTZ. He denies recurrent anginal symptoms or chest pain. He does have numbness of his feet which has improved since initiating Lyrica. He has chronic pain involving his feet back neck shoulders. He is unaware of diabetes. There is a history of depression. He denies cold or heat intolerance. Other comprehensive 12 point system review is negative.  PE BP 138/90  Pulse 84  Ht 5' 10.5" (1.791 m)  Wt 108.092 kg (238 lb 4.8 oz)  BMI 33.70 kg/m2  General: Alert, oriented, no distress.  HEENT: Normocephalic, atraumatic. Pupils round and reactive; sclera anicteric;  Nares without nasal septal hypertrophy Mouth/Parynx benign; Mallinpatti scale 2/3 Neck: No JVD, no carotid briuts Lungs: decreased BS without wheezing Heart: RRR, s1 s2 normal 1/6 SEM Abdomen: soft, nontender; no hepatosplenomehaly, BS+; abdominal aorta nontender and not dilated by palpation. Pulses 2+ Extremities: no clubbinbg cyanosis or edema, Homan's sign negative  Neurologic: grossly nonfocal   ECG: sinus rhythm at 84 beats per minute. Left axis deviation. PR interval 178 milliseconds; QTc interval 432 milliseconds  LABS:  BMET    Component Value Date/Time   NA 140 03/28/2013 1143   K 4.9 03/28/2013 1143   CL 104 03/28/2013 1143   CO2 29 03/28/2013 1143   GLUCOSE 126* 03/28/2013 1143   BUN 13 03/28/2013 1143   CREATININE 0.78 03/28/2013 1143   CREATININE 0.59 09/25/2012 1611   CALCIUM 9.5 03/28/2013 1143   GFRNONAA >90 09/25/2012 1611   GFRAA >90 09/25/2012 1611     Hepatic Function Panel     Component Value Date/Time   PROT 7.0 03/28/2013 1143   ALBUMIN 4.4 03/28/2013 1143   AST 27  03/28/2013 1143   ALT 45 03/28/2013 1143   ALKPHOS 63 03/28/2013 1143   BILITOT 0.3 03/28/2013 1143     CBC    Component Value Date/Time   WBC 7.3 05/11/2013 1645   WBC 8.3 12/01/2011 1207   RBC 5.04 05/11/2013 1645   RBC 4.43* 12/01/2011 1207   HGB 16.4 05/11/2013 1645   HGB 14.6 12/01/2011 1207   HCT 46.2 05/11/2013 1645   HCT 44.2 12/01/2011 1207   PLT 212 05/11/2013 1645  MCV 91.7 05/11/2013 1645   MCV 99.7* 12/01/2011 1207   MCH 32.5 05/11/2013 1645   MCH 33.0* 12/01/2011 1207   MCHC 35.5 05/11/2013 1645   MCHC 33.0 12/01/2011 1207   RDW 13.6 05/11/2013 1645   LYMPHSABS 2.3 05/11/2013 1645   MONOABS 0.7 05/11/2013 1645   EOSABS 0.2 05/11/2013 1645   BASOSABS 0.0 05/11/2013 1645     BNP No results found for this basename: probnp    Lipid Panel     Component Value Date/Time   CHOL 168 09/25/2012 1611   TRIG 105 09/25/2012 1611   HDL 47 09/25/2012 1611   CHOLHDL 3.6 09/25/2012 1611   VLDL 21 09/25/2012 1611   LDLCALC 100* 09/25/2012 1611     RADIOLOGY: No results found.    ASSESSMENT AND PLAN:  From a cardiac standpoint, Mr. Rauf has been stable without angina symptoms. He is status post stenting to his mid right and distal right coronary artery in May 2009. We again discussed complete smoking cessation. He does admit to significant anxiety recently in his attempt to quit smoking as well as with his recent grandmother's death. Apparently, he never did adjust his simvastatin dose. Since he is on amlodipine 5 mg I'm recommending he discontinue the simvastatin and will change this to atorvastatin 40 mg for continued aggressive lipid therapy. His resting pulse is in the mid 80s. I again have suggested low dose Bystolic at 5 mg.  I have recommended  a followup CMP, lipid panel, TSH level in 2 months. I will see him in 3 months for cardiology reevaluation.    Lennette Bihari, MD, [facility]  07/18/2013 5:12 PM

## 2013-07-18 NOTE — Progress Notes (Signed)
Pt is here for repeat testosterone level and possible bladder leaking. Tenderness under RT breast, rib cage area; tingling feeling. Requests ultrasound. Rt foot swelling; infection in toe. Only urinates twice a day.

## 2013-07-22 ENCOUNTER — Ambulatory Visit (HOSPITAL_COMMUNITY)
Admission: RE | Admit: 2013-07-22 | Discharge: 2013-07-22 | Disposition: A | Discharge status: Home / Self Care | Payer: Medicaid Other | Source: Ambulatory Visit | Attending: Internal Medicine | Admitting: Internal Medicine

## 2013-07-22 ENCOUNTER — Encounter: Payer: Self-pay | Admitting: Cardiovascular Disease

## 2013-07-22 DIAGNOSIS — K7689 Other specified diseases of liver: Secondary | ICD-10-CM | POA: Insufficient documentation

## 2013-07-22 DIAGNOSIS — R109 Unspecified abdominal pain: Secondary | ICD-10-CM | POA: Insufficient documentation

## 2013-07-24 ENCOUNTER — Telehealth: Payer: Self-pay | Admitting: Internal Medicine

## 2013-07-24 NOTE — Telephone Encounter (Signed)
Pt calling about results for ultrasound taken on 07/18/13.  Please f/u with pt.

## 2013-07-30 ENCOUNTER — Encounter: Payer: Self-pay | Admitting: Podiatry

## 2013-07-30 ENCOUNTER — Ambulatory Visit (INDEPENDENT_AMBULATORY_CARE_PROVIDER_SITE_OTHER): Payer: Medicaid Other | Admitting: Podiatry

## 2013-07-30 VITALS — BP 119/67 | HR 67 | Resp 16

## 2013-07-30 DIAGNOSIS — L97511 Non-pressure chronic ulcer of other part of right foot limited to breakdown of skin: Secondary | ICD-10-CM

## 2013-07-30 DIAGNOSIS — L97509 Non-pressure chronic ulcer of other part of unspecified foot with unspecified severity: Secondary | ICD-10-CM

## 2013-07-30 NOTE — Progress Notes (Signed)
Leonard Murray presents today for followup ulceration second digit right foot hammertoe deformity with neuropathy. He states that I think it's getting better. When asked him if he was wearing his buttress pad he states that he lost.  Objective: Vital signs are stable he is alert and oriented x3. Pulses remain palpable right lower extremity. Hammertoe deformity resulting in distal clavus was debrided today to bleeding does demonstrate well-healing abscess and ulceration second digit right foot.  Assessment: Well-healing ulcer second digit right.  Plan: Continue all conservative therapies redistribute a buttress pad today for him he will continue to dress the toe on a daily basis followup with him in one month. Once this is better I would highly recommend surgical procedure to straighten the toes of the right foot.

## 2013-07-31 ENCOUNTER — Telehealth: Payer: Self-pay | Admitting: Emergency Medicine

## 2013-07-31 ENCOUNTER — Telehealth: Payer: Self-pay | Admitting: Internal Medicine

## 2013-07-31 NOTE — Telephone Encounter (Signed)
Pt called in today to get the results of his lab test 07/18/2013;

## 2013-07-31 NOTE — Telephone Encounter (Signed)
Pt called in requesting lab results. Negative report given [place] report needs resulted.please f/u

## 2013-08-01 NOTE — Telephone Encounter (Signed)
Calling to check if still with swelling in feet. If so, please call back to discuss otherwise he does not need to return a call.

## 2013-08-05 ENCOUNTER — Telehealth: Payer: Self-pay | Admitting: *Deleted

## 2013-08-05 MED ORDER — NEBIVOLOL HCL 5 MG PO TABS: 5.0000 mg | ORAL_TABLET | Freq: Every day | ORAL | Status: DC

## 2013-08-05 NOTE — Telephone Encounter (Signed)
Called patient to inform that Bystolic 5mg  was to undergo prior auth process to get approved. Offered samples until we can get answer on approval status. Samples left at front desk, PA form completed, awaiting signature from Dr. Tresa Endo.

## 2013-08-14 ENCOUNTER — Ambulatory Visit (INDEPENDENT_AMBULATORY_CARE_PROVIDER_SITE_OTHER): Payer: Medicaid Other

## 2013-08-14 ENCOUNTER — Telehealth: Payer: Self-pay | Admitting: *Deleted

## 2013-08-14 VITALS — BP 109/66 | HR 74 | Resp 12

## 2013-08-14 DIAGNOSIS — G609 Hereditary and idiopathic neuropathy, unspecified: Secondary | ICD-10-CM

## 2013-08-14 DIAGNOSIS — G629 Polyneuropathy, unspecified: Secondary | ICD-10-CM

## 2013-08-14 DIAGNOSIS — L97509 Non-pressure chronic ulcer of other part of unspecified foot with unspecified severity: Secondary | ICD-10-CM

## 2013-08-14 DIAGNOSIS — L97501 Non-pressure chronic ulcer of other part of unspecified foot limited to breakdown of skin: Secondary | ICD-10-CM

## 2013-08-14 MED ORDER — SILVER SULFADIAZINE 1 % EX CREA: 1.0000 "application " | TOPICAL_CREAM | Freq: Every day | CUTANEOUS | Status: DC

## 2013-08-14 MED ORDER — CLINDAMYCIN HCL 150 MG PO CAPS: 150.0000 mg | ORAL_CAPSULE | Freq: Three times a day (TID) | ORAL | Status: DC

## 2013-08-14 NOTE — Patient Instructions (Signed)
ANTIBACTERIAL SOAP INSTRUCTIONS  THE DAY AFTER PROCEDURE  Please follow the instructions your doctor has marked.   Shower as usual. Before getting out, place a drop of antibacterial liquid soap (Dial) on a wet, clean washcloth.  Gently wipe washcloth over affected area.  Afterward, rinse the area with warm water.  Blot the area dry with a soft cloth and cover with antibiotic ointment (neosporin, polysporin, bacitracin) and band aid or gauze and tape  Place 3-4 drops of antibacterial liquid soap in a quart of warm tap water.  Submerge foot into water for 20 minutes.  If bandage was applied after your procedure, leave on to allow for easy lift off, then remove and continue with soak for the remaining time.  Next, blot area dry with a soft cloth and cover with a bandage.  Apply other medications as directed by your doctor, such as cortisporin otic solution (eardrops) or neosporin antibiotic ointment  Apply Silvadene cream and gauze dressing daily after cleansing.

## 2013-08-14 NOTE — Telephone Encounter (Signed)
Leonard Murray, I scheduled the pt for this afternoon with Dr. Ralene Cork.

## 2013-08-14 NOTE — Telephone Encounter (Signed)
Pt states the plastic ring that was placed on his toe has rubbed another place on the toe next to it.  Pt request an antibiotic.  I spoke with the pt, he states the dressing and plastic ring were so nice after the last visit he left it on 2 days.  When the pt removed the dressing, the 2nd toe looked like the plastic ring had grown into the toe.  I referred to the scheduler for an appt as soon as possible and will hold off on antibiotic until ordered.  I called pt again and left a message telling him I would not be calling in an antibiotic, in case the doctor he saw today prescribed something different.

## 2013-08-14 NOTE — Progress Notes (Signed)
   Subjective:    Patient ID: Leonard Murray, male    DOB: Apr 20, 1961, 52 y.o.   MRN: [id number]  HPI patient presents this time for followup patient was given a buttress or crest pad by Dr. Al Corpus couple weeks ago. She wore for 3 days neuroma without removing and working while sleeping has had problems with neuropathy as well swelling and edema in his toes as a result the silicone crest pad grew into the skin causing ischemic band around the base of the third digit right foot. The toe is somewhat ecchymotic there is some a ring of skin dehiscence down to the dermal subdermal junction at the base the second digit dorsally. Plantar skin is intact. There is some ecchymosis of the toe although temperature appears to be warm    Review of Systems  All other systems reviewed and are negative.   deferred at this visit no changes noted.     Objective:   Physical Exam Vascular status appears to be intact pedal pulses DP +2 for PT plus one over 4 bilateral Refill time 3 seconds all digits there is some ecchymosis to the third toe ulceration over the dorsum of the second third digit at its base dorsally. This is an area of eschar tissue and skin slough was results of the constrictive band over the toe. Patient continues to have neuropathy does have a history of distal clavus keratoses second and third digits right they're currently well debrided no active discharge or drainage on the end of the toe are this ulceration at the base of the  third digit. At this time Silvadene and gauze wrap was applied patient be given a prescription for antibiotic oral medication cephalexin cleanse with soap and water daily apply Silvadene and gauze wrapped maintain accommodative shoes.       Assessment & Plan:  Assessment this time is profound neuropathy with ischemic ulceration of the third digit right foot due to a compressive bandage. The band was removed by the patient this time the wound sites cleansed with all cleansed  Silvadene gauze dressing applied patient replaced back on a regimen antibiotic likely clindamycin 3 times a day. Reappointed in 2 weeks for followup with Dr. Al Corpus or myself for reevaluation contact us immediately if this needs additional changes patient is advised he may have some superficial skin slough.  Alvan Dame DPM

## 2013-08-22 ENCOUNTER — Ambulatory Visit (INDEPENDENT_AMBULATORY_CARE_PROVIDER_SITE_OTHER): Payer: Medicaid Other | Admitting: Podiatry

## 2013-08-22 ENCOUNTER — Encounter: Payer: Self-pay | Admitting: Podiatry

## 2013-08-22 VITALS — BP 129/75 | HR 71 | Temp 99.2°F | Resp 20 | Ht 70.0 in | Wt 220.0 lb

## 2013-08-22 DIAGNOSIS — L97509 Non-pressure chronic ulcer of other part of unspecified foot with unspecified severity: Secondary | ICD-10-CM

## 2013-08-22 MED ORDER — AMOXICILLIN-POT CLAVULANATE 875-125 MG PO TABS: 1.0000 | ORAL_TABLET | Freq: Two times a day (BID) | ORAL | Status: DC

## 2013-08-22 NOTE — Progress Notes (Signed)
Leonard HuaDavid presents today for followup of the cellulitis to the second third digits of his right foot. I previously seen him in which time we put a buttress pad beneath his toes. He inadvertently left buttress pad on which caused irritation and skin breakdown to the third digit a long the proximal phalanx. He was seen by Dr. Ralene Murray and he continues to take his clindamycin. However he states he has not been taking his clindamycin on regular basis because he thought it was only once a day. He says am very worried about losing the toes as they're very red and seem to be draining. He understands that is supposed to be wearing his surgical shoe but presents today in a pair of boat shoes.   Objective: Vital signs are stable he is alert and oriented x3 pulses are palpable right lower extremity. Toes 2 and 3 are red with erythema and edema to the level of metatarsophalangeal joint superficial skin breakdown and degloving of the distal tuft of the toe nothing probable to bone. Cellulitis ulceration to the toe that appears to be it. There no pus pockets.  Assessment: Assessment is cellulitis with skin breakdown and ulceration second third toes right foot.  Plan: Continue clindamycin 150 mg 3 times a day start Augmentin 875 mg twice daily. He'll soak in Epsom salts and water and redressed the toes each time with Bactroban or Silvadene cream. Use the Darco shoe while at home walking and I will followup with him in one to 2 weeks area he will watch for signs and symptoms of infection such as fever chills nausea vomiting muscle aches or pains and worsening of the discoloration of the toe or increased redness of the foot. Should these arise he'll notify us and go to the emergency room.

## 2013-08-23 ENCOUNTER — Telehealth: Payer: Self-pay | Admitting: *Deleted

## 2013-08-23 NOTE — Telephone Encounter (Signed)
[facility] for [facility]olic to [phone].

## 2013-08-27 ENCOUNTER — Ambulatory Visit: Payer: Medicaid Other | Admitting: Podiatry

## 2013-08-28 ENCOUNTER — Ambulatory Visit: Payer: Medicaid Other

## 2013-09-03 ENCOUNTER — Encounter: Payer: Self-pay | Admitting: Podiatry

## 2013-09-03 ENCOUNTER — Ambulatory Visit (INDEPENDENT_AMBULATORY_CARE_PROVIDER_SITE_OTHER): Payer: Medicaid Other | Admitting: Podiatry

## 2013-09-03 VITALS — BP 82/49 | HR 75 | Temp 97.8°F | Resp 16

## 2013-09-03 DIAGNOSIS — L97509 Non-pressure chronic ulcer of other part of unspecified foot with unspecified severity: Secondary | ICD-10-CM

## 2013-09-03 MED ORDER — CLINDAMYCIN HCL 150 MG PO CAPS: 150.0000 mg | ORAL_CAPSULE | Freq: Three times a day (TID) | ORAL | Status: DC

## 2013-09-03 NOTE — Progress Notes (Signed)
Right foot #2/#3 toes ulcer , the 3rd toe not doing too well . He denies fever chills nausea vomiting muscle aches or pains. He states that his is a little tired today after being a very late last night and early this morning. He states that the toes seem to be doing a lot better. His continuing his soaking and dressing regimen daily and continues to wear the Darco shoe.  Objective: Vital signs are stable mildly hypotensive today. Pulses are palpable right erythema has decreased considerably to toes 2 and 3 of the right foot however am still concerned about the viability of this the second and third toes. I will suggest he continue his antibiotics and dressing regimen.  Assessment: Slowly resolving cellulitis to toes 2 and 3 right ulceration skin breakdown still present. But improving  Plan: Continue all conservative therapies refill clindamycin followup with me in 2 weeks

## 2013-09-12 ENCOUNTER — Other Ambulatory Visit: Payer: Self-pay | Admitting: *Deleted

## 2013-09-12 MED ORDER — CLOPIDOGREL BISULFATE 75 MG PO TABS: 75.0000 mg | ORAL_TABLET | Freq: Every day | ORAL | Status: DC

## 2013-09-12 NOTE — Telephone Encounter (Signed)
Rx was sent to pharmacy electronically. 

## 2013-09-17 ENCOUNTER — Telehealth: Payer: Self-pay | Admitting: *Deleted

## 2013-09-17 ENCOUNTER — Encounter: Payer: Self-pay | Admitting: Podiatry

## 2013-09-17 ENCOUNTER — Other Ambulatory Visit: Payer: Self-pay | Admitting: *Deleted

## 2013-09-17 ENCOUNTER — Ambulatory Visit (INDEPENDENT_AMBULATORY_CARE_PROVIDER_SITE_OTHER): Payer: Medicaid Other | Admitting: Podiatry

## 2013-09-17 VITALS — BP 116/63 | HR 76 | Resp 18

## 2013-09-17 DIAGNOSIS — L97509 Non-pressure chronic ulcer of other part of unspecified foot with unspecified severity: Secondary | ICD-10-CM

## 2013-09-17 MED ORDER — CLINDAMYCIN HCL 150 MG PO CAPS: 150.0000 mg | ORAL_CAPSULE | Freq: Three times a day (TID) | ORAL | Status: DC

## 2013-09-17 MED ORDER — LEVOFLOXACIN 750 MG PO TABS: 750.0000 mg | ORAL_TABLET | Freq: Every day | ORAL | Status: DC

## 2013-09-17 NOTE — Progress Notes (Signed)
Ulcer check right foot #2,#3 toes, pt states i think its getting worse. He states that he may have wrapped in gauze too tight. His fishing up his antibiotics. He denies fever chills nausea vomiting muscle aches and pains.  Objective: Second digit of the right foot appears to be healing quite nicely however the third digit plan greatly concerned about. The advances that we had made 2 weeks ago have gone on to result in sloughing of the skin of the right toe down to the plantar flexor tendon third right at this point I think his small vessel disease and overwrapping the toe is resulting in worsening in the consent of the condition.  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 I am suggesting we sent him to wound care and I imagine an amputation of the third toe will be necessary.  Plan: Wound care and I suggested that he followup for blood work for evaluation of diabetes. We also encouraged him to stop smoking.

## 2013-09-17 NOTE — Telephone Encounter (Signed)
Entered in error

## 2013-09-18 ENCOUNTER — Emergency Department (HOSPITAL_COMMUNITY): Payer: Medicare Other

## 2013-09-18 ENCOUNTER — Encounter (HOSPITAL_COMMUNITY): Payer: Self-pay | Admitting: Emergency Medicine

## 2013-09-18 ENCOUNTER — Ambulatory Visit (HOSPITAL_BASED_OUTPATIENT_CLINIC_OR_DEPARTMENT_OTHER): Payer: Medicare Other | Admitting: Internal Medicine

## 2013-09-18 ENCOUNTER — Encounter: Payer: Self-pay | Admitting: Internal Medicine

## 2013-09-18 ENCOUNTER — Inpatient Hospital Stay (HOSPITAL_COMMUNITY)
Admission: EM | Admit: 2013-09-18 | Discharge: 2013-09-20 | DRG: 540 | Disposition: A | Discharge status: Home Health | Payer: Medicare Other | Attending: Internal Medicine | Admitting: Internal Medicine

## 2013-09-18 VITALS — BP 70/50 | HR 74 | Temp 98.0°F | Resp 16 | Ht 70.0 in | Wt 229.0 lb

## 2013-09-18 DIAGNOSIS — N289 Disorder of kidney and ureter, unspecified: Secondary | ICD-10-CM

## 2013-09-18 DIAGNOSIS — I2510 Atherosclerotic heart disease of native coronary artery without angina pectoris: Secondary | ICD-10-CM | POA: Diagnosis present

## 2013-09-18 DIAGNOSIS — E860 Dehydration: Secondary | ICD-10-CM | POA: Diagnosis present

## 2013-09-18 DIAGNOSIS — I252 Old myocardial infarction: Secondary | ICD-10-CM

## 2013-09-18 DIAGNOSIS — N179 Acute kidney failure, unspecified: Secondary | ICD-10-CM | POA: Diagnosis present

## 2013-09-18 DIAGNOSIS — Z23 Encounter for immunization: Secondary | ICD-10-CM | POA: Diagnosis not present

## 2013-09-18 DIAGNOSIS — I959 Hypotension, unspecified: Secondary | ICD-10-CM | POA: Diagnosis present

## 2013-09-18 DIAGNOSIS — E785 Hyperlipidemia, unspecified: Secondary | ICD-10-CM | POA: Diagnosis present

## 2013-09-18 DIAGNOSIS — F3289 Other specified depressive episodes: Secondary | ICD-10-CM | POA: Diagnosis present

## 2013-09-18 DIAGNOSIS — Z79899 Other long term (current) drug therapy: Secondary | ICD-10-CM | POA: Diagnosis not present

## 2013-09-18 DIAGNOSIS — Z7982 Long term (current) use of aspirin: Secondary | ICD-10-CM

## 2013-09-18 DIAGNOSIS — F329 Major depressive disorder, single episode, unspecified: Secondary | ICD-10-CM | POA: Diagnosis present

## 2013-09-18 DIAGNOSIS — R259 Unspecified abnormal involuntary movements: Secondary | ICD-10-CM | POA: Diagnosis present

## 2013-09-18 DIAGNOSIS — Z9861 Coronary angioplasty status: Secondary | ICD-10-CM | POA: Diagnosis not present

## 2013-09-18 DIAGNOSIS — G8929 Other chronic pain: Secondary | ICD-10-CM | POA: Diagnosis present

## 2013-09-18 DIAGNOSIS — F17200 Nicotine dependence, unspecified, uncomplicated: Secondary | ICD-10-CM | POA: Diagnosis present

## 2013-09-18 DIAGNOSIS — I10 Essential (primary) hypertension: Secondary | ICD-10-CM | POA: Diagnosis present

## 2013-09-18 DIAGNOSIS — G609 Hereditary and idiopathic neuropathy, unspecified: Secondary | ICD-10-CM | POA: Diagnosis present

## 2013-09-18 DIAGNOSIS — G4733 Obstructive sleep apnea (adult) (pediatric): Secondary | ICD-10-CM | POA: Diagnosis present

## 2013-09-18 DIAGNOSIS — M549 Dorsalgia, unspecified: Secondary | ICD-10-CM | POA: Diagnosis present

## 2013-09-18 DIAGNOSIS — R42 Dizziness and giddiness: Secondary | ICD-10-CM

## 2013-09-18 DIAGNOSIS — Z7902 Long term (current) use of antithrombotics/antiplatelets: Secondary | ICD-10-CM

## 2013-09-18 DIAGNOSIS — F1010 Alcohol abuse, uncomplicated: Secondary | ICD-10-CM | POA: Diagnosis present

## 2013-09-18 DIAGNOSIS — L97509 Non-pressure chronic ulcer of other part of unspecified foot with unspecified severity: Secondary | ICD-10-CM | POA: Diagnosis present

## 2013-09-18 DIAGNOSIS — M869 Osteomyelitis, unspecified: Secondary | ICD-10-CM | POA: Diagnosis present

## 2013-09-18 HISTORY — DX: Anxiety disorder, unspecified: F41.9

## 2013-09-18 HISTORY — DX: Acute myocardial infarction, unspecified: I21.9

## 2013-09-18 HISTORY — DX: Cerebral infarction, unspecified: I63.9

## 2013-09-18 HISTORY — DX: Acute kidney failure, unspecified: N17.9

## 2013-09-18 HISTORY — DX: Angina pectoris, unspecified: I20.9

## 2013-09-18 LAB — CBC WITH DIFFERENTIAL/PLATELET
BASOS ABS: 0 10*3/uL (ref 0.0–0.1)
Basophils Relative: 0 % (ref 0–1)
EOS PCT: 2 % (ref 0–5)
Eosinophils Absolute: 0.2 10*3/uL (ref 0.0–0.7)
HEMATOCRIT: 40.3 % (ref 39.0–52.0)
Hemoglobin: 14.3 g/dL (ref 13.0–17.0)
Lymphocytes Relative: 35 % (ref 12–46)
Lymphs Abs: 3.7 10*3/uL (ref 0.7–4.0)
MCH: 31.1 pg (ref 26.0–34.0)
MCHC: 35.5 g/dL (ref 30.0–36.0)
MCV: 87.6 fL (ref 78.0–100.0)
MONO ABS: 0.8 10*3/uL (ref 0.1–1.0)
Monocytes Relative: 8 % (ref 3–12)
Neutro Abs: 5.8 10*3/uL (ref 1.7–7.7)
Neutrophils Relative %: 55 % (ref 43–77)
Platelets: 195 10*3/uL (ref 150–400)
RBC: 4.6 MIL/uL (ref 4.22–5.81)
RDW: 13.5 % (ref 11.5–15.5)
WBC: 10.5 10*3/uL (ref 4.0–10.5)

## 2013-09-18 LAB — COMPREHENSIVE METABOLIC PANEL
ALT: 19 U/L (ref 0–53)
AST: 18 U/L (ref 0–37)
Albumin: 3.8 g/dL (ref 3.5–5.2)
Alkaline Phosphatase: 92 U/L (ref 39–117)
BUN: 26 mg/dL — ABNORMAL HIGH (ref 6–23)
CALCIUM: 9.3 mg/dL (ref 8.4–10.5)
CO2: 23 meq/L (ref 19–32)
CREATININE: 1.68 mg/dL — AB (ref 0.50–1.35)
Chloride: 100 mEq/L (ref 96–112)
GFR calc Af Amer: 52 mL/min — ABNORMAL LOW (ref >90)
GFR, EST NON AFRICAN AMERICAN: 45 mL/min — AB (ref >90)
Glucose, Bld: 103 mg/dL — ABNORMAL HIGH (ref 70–99)
Potassium: 4 mEq/L (ref 3.7–5.3)
Sodium: 139 mEq/L (ref 137–147)
Total Bilirubin: 0.5 mg/dL (ref 0.3–1.2)
Total Protein: 7.7 g/dL (ref 6.0–8.3)

## 2013-09-18 LAB — CG4 I-STAT (LACTIC ACID): Lactic Acid, Venous: 2.53 mmol/L — ABNORMAL HIGH (ref 0.5–2.2)

## 2013-09-18 LAB — TROPONIN I: Troponin I: <0.3 ng/mL (ref <0.30)

## 2013-09-18 LAB — HEMOGLOBIN A1C
HEMOGLOBIN A1C: 5.6 % (ref <5.7)
MEAN PLASMA GLUCOSE: 114 mg/dL (ref <117)

## 2013-09-18 MED ORDER — PREGABALIN 50 MG PO CAPS
100.0000 mg | ORAL_CAPSULE | Freq: Three times a day (TID) | ORAL | Status: DC
  Administered 2013-09-19 – 2013-09-20 (×5): 100 mg via ORAL
  Filled 2013-09-18 (×5): qty 2

## 2013-09-18 MED ORDER — VANCOMYCIN HCL IN DEXTROSE 1-5 GM/200ML-% IV SOLN
1000.0000 mg | Freq: Once | INTRAVENOUS | Status: AC
  Administered 2013-09-18: 1000 mg via INTRAVENOUS
  Filled 2013-09-18: qty 200

## 2013-09-18 MED ORDER — SODIUM CHLORIDE 0.9 % IJ SOLN
3.0000 mL | Freq: Two times a day (BID) | INTRAMUSCULAR | Status: DC
  Administered 2013-09-18 – 2013-09-19 (×3): 3 mL via INTRAVENOUS

## 2013-09-18 MED ORDER — AMITRIPTYLINE HCL 100 MG PO TABS
100.0000 mg | ORAL_TABLET | Freq: Every day | ORAL | Status: DC
  Administered 2013-09-19 (×2): 100 mg via ORAL
  Filled 2013-09-18 (×3): qty 1

## 2013-09-18 MED ORDER — SODIUM CHLORIDE 0.9 % IV BOLUS (SEPSIS)
500.0000 mL | Freq: Once | INTRAVENOUS | Status: AC
Start: 2013-09-18 — End: 2013-09-18
  Administered 2013-09-18: 500 mL via INTRAVENOUS

## 2013-09-18 MED ORDER — VITAMIN B-1 100 MG PO TABS: 100.0000 mg | ORAL_TABLET | Freq: Every day | ORAL | Status: DC

## 2013-09-18 MED ORDER — OXYCODONE HCL ER 20 MG PO T12A
40.0000 mg | EXTENDED_RELEASE_TABLET | Freq: Two times a day (BID) | ORAL | Status: DC
  Administered 2013-09-18 – 2013-09-20 (×4): 40 mg via ORAL
  Filled 2013-09-18 (×4): qty 4

## 2013-09-18 MED ORDER — HYDRALAZINE HCL 20 MG/ML IJ SOLN: 10.0000 mg | INTRAMUSCULAR | Status: DC | PRN

## 2013-09-18 MED ORDER — CLOPIDOGREL BISULFATE 75 MG PO TABS
75.0000 mg | ORAL_TABLET | Freq: Every day | ORAL | Status: DC
  Administered 2013-09-19 – 2013-09-20 (×2): 75 mg via ORAL
  Filled 2013-09-18 (×3): qty 1

## 2013-09-18 MED ORDER — ATORVASTATIN CALCIUM 40 MG PO TABS
40.0000 mg | ORAL_TABLET | Freq: Every day | ORAL | Status: DC
  Administered 2013-09-19 – 2013-09-20 (×2): 40 mg via ORAL
  Filled 2013-09-18 (×2): qty 1

## 2013-09-18 MED ORDER — LORAZEPAM 2 MG/ML IJ SOLN: 1.0000 mg | Freq: Four times a day (QID) | INTRAMUSCULAR | Status: DC | PRN

## 2013-09-18 MED ORDER — OXYCODONE HCL 20 MG PO TB12
40.0000 mg | ORAL_TABLET | Freq: Two times a day (BID) | ORAL | Status: DC
Start: 2013-09-18 — End: 2013-09-19

## 2013-09-18 MED ORDER — SODIUM CHLORIDE 0.9 % IV SOLN
INTRAVENOUS | Status: AC
  Administered 2013-09-19 (×3): via INTRAVENOUS

## 2013-09-18 MED ORDER — THIAMINE HCL 100 MG/ML IJ SOLN
100.0000 mg | Freq: Every day | INTRAMUSCULAR | Status: DC
  Filled 2013-09-18 (×2): qty 1

## 2013-09-18 MED ORDER — PAROXETINE HCL 20 MG PO TABS
20.0000 mg | ORAL_TABLET | ORAL | Status: DC
  Administered 2013-09-19 – 2013-09-20 (×2): 20 mg via ORAL
  Filled 2013-09-18 (×3): qty 1

## 2013-09-18 MED ORDER — OXYCODONE HCL 5 MG PO TABS
15.0000 mg | ORAL_TABLET | ORAL | Status: DC
Start: 2013-09-19 — End: 2013-09-19
  Administered 2013-09-19 (×3): 15 mg via ORAL
  Filled 2013-09-18 (×4): qty 3

## 2013-09-18 MED ORDER — ADULT MULTIVITAMIN W/MINERALS CH
1.0000 | ORAL_TABLET | Freq: Every day | ORAL | Status: DC
  Administered 2013-09-19 – 2013-09-20 (×2): 1 via ORAL
  Filled 2013-09-18 (×2): qty 1

## 2013-09-18 MED ORDER — ALPRAZOLAM 0.5 MG PO TABS
1.0000 mg | ORAL_TABLET | Freq: Once | ORAL | Status: AC
  Administered 2013-09-18: 1 mg via ORAL
  Filled 2013-09-18: qty 2

## 2013-09-18 MED ORDER — ASPIRIN EC 81 MG PO TBEC
81.0000 mg | DELAYED_RELEASE_TABLET | Freq: Every day | ORAL | Status: DC
  Administered 2013-09-19 – 2013-09-20 (×2): 81 mg via ORAL
  Filled 2013-09-18 (×2): qty 1

## 2013-09-18 MED ORDER — ACETAMINOPHEN 325 MG PO TABS: 650.0000 mg | ORAL_TABLET | Freq: Four times a day (QID) | ORAL | Status: DC | PRN

## 2013-09-18 MED ORDER — ONDANSETRON HCL 4 MG PO TABS: 4.0000 mg | ORAL_TABLET | Freq: Four times a day (QID) | ORAL | Status: DC | PRN

## 2013-09-18 MED ORDER — SODIUM CHLORIDE 0.9 % IV BOLUS (SEPSIS)
1000.0000 mL | Freq: Once | INTRAVENOUS | Status: AC
  Administered 2013-09-18: 1000 mL via INTRAVENOUS

## 2013-09-18 MED ORDER — FOLIC ACID 1 MG PO TABS
1.0000 mg | ORAL_TABLET | Freq: Every day | ORAL | Status: DC
  Administered 2013-09-19 – 2013-09-20 (×2): 1 mg via ORAL
  Filled 2013-09-18 (×2): qty 1

## 2013-09-18 MED ORDER — OXYCODONE HCL 5 MG PO TABS
15.0000 mg | ORAL_TABLET | ORAL | Status: DC | PRN
  Administered 2013-09-18 – 2013-09-20 (×4): 15 mg via ORAL
  Filled 2013-09-18 (×5): qty 3

## 2013-09-18 MED ORDER — ALPRAZOLAM 0.25 MG PO TABS
1.0000 mg | ORAL_TABLET | Freq: Three times a day (TID) | ORAL | Status: DC | PRN
  Administered 2013-09-19 – 2013-09-20 (×2): 1 mg via ORAL
  Filled 2013-09-18 (×3): qty 4
  Filled 2013-09-18: qty 2

## 2013-09-18 MED ORDER — LORAZEPAM 1 MG PO TABS: 1.0000 mg | ORAL_TABLET | Freq: Four times a day (QID) | ORAL | Status: DC | PRN

## 2013-09-18 MED ORDER — ONDANSETRON HCL 4 MG/2ML IJ SOLN: 4.0000 mg | Freq: Four times a day (QID) | INTRAMUSCULAR | Status: DC | PRN

## 2013-09-18 MED ORDER — NITROGLYCERIN 0.4 MG SL SUBL: 0.4000 mg | SUBLINGUAL_TABLET | SUBLINGUAL | Status: DC | PRN

## 2013-09-18 MED ORDER — VITAMIN B-1 100 MG PO TABS
100.0000 mg | ORAL_TABLET | Freq: Every day | ORAL | Status: DC
  Administered 2013-09-19 – 2013-09-20 (×2): 100 mg via ORAL
  Filled 2013-09-18 (×2): qty 1

## 2013-09-18 MED ORDER — PANTOPRAZOLE SODIUM 40 MG PO TBEC
40.0000 mg | DELAYED_RELEASE_TABLET | Freq: Every day | ORAL | Status: DC
  Administered 2013-09-19 – 2013-09-20 (×2): 40 mg via ORAL
  Filled 2013-09-18 (×2): qty 1

## 2013-09-18 MED ORDER — ACETAMINOPHEN 650 MG RE SUPP: 650.0000 mg | Freq: Four times a day (QID) | RECTAL | Status: DC | PRN

## 2013-09-18 MED ORDER — PNEUMOCOCCAL VAC POLYVALENT 25 MCG/0.5ML IJ INJ
0.5000 mL | INJECTION | INTRAMUSCULAR | Status: DC
  Filled 2013-09-18: qty 0.5

## 2013-09-18 NOTE — Patient Instructions (Signed)
Pt was instructed to go to the ER immediately after the visit. I called the pt after noticing that he was not admitted to the ER after several hours. I spoke to the pt at 5:00pm I instructed him that it was necessary for him to stop what he was doing and get over to the ER ASAP.

## 2013-09-18 NOTE — H&P (Signed)
[facility] History and Physical  Evo Aderman ZOX:[id number] DOB: Sep 25, 1960 DOA: 09/18/2013  Referring physician: ER physician. PCP: Jeanann Lewandowsky, MD  Specialists: [facility]. Dr. Tresa Endo.  Chief Complaint: Hypotension.  HPI: Leonard Murray is a 53 y.o. male with history of CAD status post stenting, hypertension, hyperlipidemia, OSA, chronic pain, anxiety disorder who was referred to the ER after patient was found to be hypotensive at his primary care's office. Patient states that a month ago he has started developing some ulcers on his right foot second toe. He had some dressing placed on his third toe by his podiatrist 4 weeks ago. Over the last 3 weeks he noticed that his third toe on the right foot has become increasingly macerated. He was started on empiric antibiotics by his podiatrist. His podiatrist has advised him to follow with his primary care to check for diabetes and Parkinson's. So when he had gone to his primary care and was found to be hypotensive he was refer to the ER. In the ER patient was found to have blood pressures in the low 70s systolic and improved with 2 L normal saline bolus. His lactic acid was high. X-rays of the right foot shows possible osteomyelitis of the third toe of the right foot. Patient has been started on vancomycin. Patient states that yesterday he had the having some dizziness and also had sweating spells. He did not pass out or did not have any chest pain shortness of breath nausea vomiting abdominal pain or diarrhea. Patient had similar episode in 2013 with hypotension where he had extensive workup including ACTH stimulation test all of which were unremarkable as per the records. 2-D echo done at that time were showing EF of 55-60%. Patient also states that over the last 6 months he has been having increasing tremors of his both upper extremities. Patient has stopped drinking alcohol over the last one week.  Review of  Systems: As presented in the history of presenting illness, rest negative.  Past Medical History  Diagnosis Date  . CAD (coronary artery disease) 12/2007    AMI with stenting; retinal embolus 08/2005; stenting 03/2009  . Hyperlipidemia   . Hypertension   . OSA (obstructive sleep apnea)     not using CPAP-feels suffocating  . GAD (generalized anxiety disorder)   . Depression   . Chronic back pain     injuries 1998, 2008, 01/2009  . Peripheral neuropathy   . Hypogonadism male     can't afford testosterone   Past Surgical History  Procedure Laterality Date  . Bony pelvis surgery    . Bladder repair    . Fracture surgery    . Cardiac catheterization    . Coronary angioplasty     Social History:  reports that he has been smoking Cigarettes.  He has been smoking about 1.50 packs per day. He has never used smokeless tobacco. He reports that he drinks about 0.6 ounces of alcohol per week. He reports that he does not use illicit drugs. Where does patient live home. Can patient participate in ADLs? Yes.  No Known Allergies  Family History:  Family History  Problem Relation Age of Onset  . Diabetes Mellitus II Mother   . Diabetes Mellitus II Father       Prior to Admission medications   Medication Sig Start Date End Date Taking? Authorizing Provider  ALPRAZolam Prudy Feeler) 1 MG tablet Take 1 mg by mouth 3 (three) times daily as needed. For anxiety  Yes Historical Provider, MD  amitriptyline (ELAVIL) 100 MG tablet Take 100 mg by mouth at bedtime.   Yes Historical Provider, MD  amLODipine (NORVASC) 5 MG tablet Take 5 mg by mouth daily.   Yes Historical Provider, MD  aspirin EC 81 MG tablet Take 81 mg by mouth daily.   Yes Historical Provider, MD  atorvastatin (LIPITOR) 40 MG tablet Take 1 tablet (40 mg total) by mouth daily. 07/03/13  Yes Lennette Biharihomas A Kelly, MD  clindamycin (CLEOCIN) 150 MG capsule Take 1 capsule (150 mg total) by mouth 3 (three) times daily. 09/17/13  Yes Max T Hyatt, DPM   clopidogrel (PLAVIX) 75 MG tablet Take 1 tablet (75 mg total) by mouth daily. 09/12/13  Yes Lennette Biharihomas A Kelly, MD  furosemide (LASIX) 40 MG tablet Take 40 mg by mouth daily as needed (swlling).    Yes Historical Provider, MD  isosorbide mononitrate (IMDUR) 60 MG 24 hr tablet Take 30 mg by mouth daily.   Yes Historical Provider, MD  levofloxacin (LEVAQUIN) 750 MG tablet Take 1 tablet (750 mg total) by mouth daily. 09/17/13  Yes Max T Hyatt, DPM  lisinopril (PRINIVIL,ZESTRIL) 20 MG tablet Take 20 mg by mouth daily.   Yes Historical Provider, MD  nebivolol (BYSTOLIC) 5 MG tablet Take 1 tablet (5 mg total) by mouth daily. 08/05/13  Yes Lennette Biharihomas A Kelly, MD  nitroGLYCERIN (NITROSTAT) 0.4 MG SL tablet Place 0.4 mg under the tongue every 5 (five) minutes as needed for chest pain.   Yes Historical Provider, MD  oxyCODONE (OXYCONTIN) 20 MG 12 hr tablet Take 40 mg by mouth every 12 (twelve) hours.    Yes Historical Provider, MD  oxyCODONE (ROXICODONE) 15 MG immediate release tablet Take 15 mg by mouth every 4 (four) hours.   Yes Historical Provider, MD  pantoprazole (PROTONIX) 40 MG tablet Take 1 tablet (40 mg total) by mouth daily. 07/18/13  Yes Alison MurrayAlma M Devine, MD  PARoxetine (PAXIL) 20 MG tablet Take 20 mg by mouth every morning.   Yes Historical Provider, MD  pregabalin (LYRICA) 100 MG capsule Take 100 mg by mouth 3 (three) times daily.   Yes Historical Provider, MD    Physical Exam: Filed Vitals:   09/18/13 2130 09/18/13 2200 09/18/13 2230 09/18/13 2233  BP: 94/51 102/57 110/57 110/57  Pulse: 55 54 66 61  Temp:      TempSrc:      Resp: 12 15 16 20   Weight:      SpO2: 98% 97% 99% 100%     General:  Well developed and nourished.  Eyes:  Anicteric no pallor.  ENT: No discharge from the ears eyes nose mouth.  Neck: No mass felt.  Cardiovascular: S1-S2 heard.  Respiratory: No rhonchi or crepitations.  Abdomen: Soft nontender bowel sounds present. No guarding or rigidity.  Skin: Skin in the  right middle toe and right second toe looks macerated.  Musculoskeletal: No edema. See skin changes.  Psychiatric: Appears normal.  Neurologic: Alert awake oriented to time place and person. Moves all extremities 5 x 5. Has coarse tremors of the upper extremities.  Labs on Admission:  Basic Metabolic Panel:  Recent Labs Lab 09/18/13 1859  NA 139  K 4.0  CL 100  CO2 23  GLUCOSE 103*  BUN 26*  CREATININE 1.68*  CALCIUM 9.3   Liver Function Tests:  Recent Labs Lab 09/18/13 1859  AST 18  ALT 19  ALKPHOS 92  BILITOT 0.5  PROT 7.7  ALBUMIN 3.8  No results found for this basename: LIPASE, AMYLASE,  in the last 168 hours No results found for this basename: AMMONIA,  in the last 168 hours CBC:  Recent Labs Lab 09/18/13 1859  WBC 10.5  NEUTROABS 5.8  HGB 14.3  HCT 40.3  MCV 87.6  PLT 195   Cardiac Enzymes:  Recent Labs Lab 09/18/13 1859  TROPONINI <0.30    BNP (last 3 results) No results found for this basename: PROBNP,  in the last 8760 hours CBG: No results found for this basename: GLUCAP,  in the last 168 hours  Radiological Exams on Admission: Dg Chest 2 View  09/18/2013   CLINICAL DATA:  Hypotension  EXAM: CHEST  2 VIEW  COMPARISON:  June 16, 2012  FINDINGS: There is no edema or consolidation. Heart size and pulmonary vascularity are normal. No adenopathy. No bone lesions.  IMPRESSION: No edema or consolidation.   Electronically Signed   By: Bretta Bang M.D.   On: 09/18/2013 20:59   Dg Toe 3rd Right  09/18/2013   CLINICAL DATA:  Ulcer with open wound  EXAM: RIGHT THIRD TOE  COMPARISON:  May 30, 2013  FINDINGS: Frontal, oblique, and lateral views were obtained. There is evidence of a degree of bony destruction in the third distal phalanx. There is no well-defined fracture or dislocation. Joint spaces appear intact.  IMPRESSION: Evidence of bony destruction involving much of the third distal phalanx. No acute fracture or dislocation.    Electronically Signed   By: Bretta Bang M.D.   On: 09/18/2013 21:03    EKG: Independently reviewed. Normal sinus rhythm.  Assessment/Plan Principal Problem:   Hypotension Active Problems:   CAD (coronary artery disease)   Hyperlipidemia   Chronic back pain   Osteomyelitis   ARF (acute renal failure)   1. Hypotension - patient does not look septic at this time. Blood cultures have been sent. Since patient has history of CAD we will cycle cardiac markers. Check cortisol levels. Continue with hydration. Recheck lactic acid levels and labs in a.m. 2. Right foot third toe osteomyelitis - patient has been placed on vancomycin. Consult orthopedic surgeon in a.m. 3. History of CAD status post stenting - denies any chest pain but given the hypotension and sweating spells I have ordered cardiac markers. 4. Tremors - patient is nonfocal at this time and patient states he's been having tremors for last 6 months. Patient also has stopped drinking alcohol last one week. For now I have placed patient on Ativan withdrawal protocol with thiamine. Patient will eventually need neurology followup. 5. History of hypertension - I am holding off all of patient's antihypertensives. Continue hydration. 6. Chronic back pain - continue patient's pain medications. 7. Acute renal failure - probably from hypotension medications and dehydration. Hold antihypertensives and continue hydration. Closely follow intake output and metabolic panel. Check urinalysis. 8. History of alcoholism - see #4. 9. Tobacco abuse - advised patient to quit smoking.  I have reviewed patient's old charts and labs.  Code Status: Full code.  Family Communication: None.  Disposition Plan: Admit to inpatient.    Laurin Morgenstern N. [facility] Pager [phone].  If 7PM-7AM, please contact night-coverage [email or web address] Password [email or web address] 09/18/2013, 11:10 PM

## 2013-09-18 NOTE — Progress Notes (Signed)
Pt is here requesting to be tested for diabetes and parkinson's disease.

## 2013-09-18 NOTE — ED Notes (Signed)
Pt c/o dizziness upon standing, went to doctors office for that and ulcer on his toe. Pt's BP at doctors office was low so they had him come into ED for further evaluation. Pt sts he thinks he may of taken an extra pain pill today. Pt also takes lasix daily. Pt c/o pain back, neck, left and shoulder, all are chronic pain places from previous injury. Pt in nad, skin warm and dry, resp e/u. Maintaining good mental status. Pt is a&Ox4

## 2013-09-18 NOTE — ED Notes (Signed)
Notified RN Casimiro Needle(Michael) of elevated i-stat Lactic Acid of 2.53

## 2013-09-18 NOTE — ED Notes (Signed)
Md aware of BP 81/44 gave bolus

## 2013-09-18 NOTE — ED Notes (Signed)
Pt also concerned he had diabetes and wants testing for that.

## 2013-09-18 NOTE — Progress Notes (Signed)
MRN: [id number] Name: Leonard Murray  Sex: male Age: 53 y.o. DOB: 11-Sep-1960  Allergies: Review of patient's allergies indicates no known allergies.  Chief Complaint  Patient presents with  . Follow-up    HPI: Patient is 53 y.o. male who has history of CAD, hyperlipidemia chronic pain on pain medications, chem today to be checked for diabetes reported to have dizziness like he is going to black out, patient does follow up with the cardiologist, today's blood pressure is low repeat manual blood pressure 70/50, patient denies any chest pain, patient also smokes cigarettes. As per patient he had mini stroke in the past also history of MI.  Past Medical History  Diagnosis Date  . CAD (coronary artery disease) 12/2007    AMI with stenting; retinal embolus 08/2005; stenting 03/2009  . Hyperlipidemia   . Hypertension   . OSA (obstructive sleep apnea)     not using CPAP-feels suffocating  . GAD (generalized anxiety disorder)   . Depression   . Chronic back pain     injuries 1998, 2008, 01/2009  . Peripheral neuropathy   . Hypogonadism male     can't afford testosterone    Past Surgical History  Procedure Laterality Date  . Bony pelvis surgery    . Bladder repair    . Fracture surgery    . Cardiac valve replacement        Medication List       This list is accurate as of: 09/18/13  2:48 PM.  Always use your most recent med list.               ALPRAZolam 1 MG tablet  Commonly known as:  XANAX  Take 1 mg by mouth 3 (three) times daily as needed. For anxiety     amitriptyline 100 MG tablet  Commonly known as:  ELAVIL  Take 100 mg by mouth at bedtime.     amLODipine 5 MG tablet  Commonly known as:  NORVASC  Take 5 mg by mouth daily.     amoxicillin-clavulanate 875-125 MG per tablet  Commonly known as:  AUGMENTIN  Take 1 tablet by mouth 2 (two) times daily.     aspirin EC 81 MG tablet  Take 81 mg by mouth daily.     atorvastatin 40 MG tablet  Commonly known as:   LIPITOR  Take 1 tablet (40 mg total) by mouth daily.     clindamycin 150 MG capsule  Commonly known as:  CLEOCIN  Take 1 capsule (150 mg total) by mouth 3 (three) times daily.     clopidogrel 75 MG tablet  Commonly known as:  PLAVIX  Take 1 tablet (75 mg total) by mouth daily.     furosemide 40 MG tablet  Commonly known as:  LASIX  Take 40 mg by mouth daily as needed (swlling).     HYDROCHLOROTHIAZIDE PO  Take 1 tablet by mouth once.     isosorbide mononitrate 60 MG 24 hr tablet  Commonly known as:  IMDUR  Take 30 mg by mouth daily.     levofloxacin 750 MG tablet  Commonly known as:  LEVAQUIN  Take 1 tablet (750 mg total) by mouth daily.     lisinopril 20 MG tablet  Commonly known as:  PRINIVIL,ZESTRIL  TAKE ONE (1) TABLET BY MOUTH EVERY DAY (MUST KEEP APPOINTMENT)     nebivolol 5 MG tablet  Commonly known as:  BYSTOLIC  Take 1 tablet (5 mg total) by mouth daily.  nitroGLYCERIN 0.4 MG SL tablet  Commonly known as:  NITROSTAT  Place 0.4 mg under the tongue every 5 (five) minutes as needed for chest pain.     oxyCODONE 20 MG 12 hr tablet  Commonly known as:  OXYCONTIN  Take 20 mg by mouth every 12 (twelve) hours.     oxyCODONE 15 MG immediate release tablet  Commonly known as:  ROXICODONE  Take 15 mg by mouth every 4 (four) hours.     pantoprazole 40 MG tablet  Commonly known as:  PROTONIX  Take 1 tablet (40 mg total) by mouth daily.     PARoxetine 20 MG tablet  Commonly known as:  PAXIL  Take 20 mg by mouth every morning.     pregabalin 100 MG capsule  Commonly known as:  LYRICA  Take 100 mg by mouth 3 (three) times daily.     silver sulfADIAZINE 1 % cream  Commonly known as:  SILVADENE  Apply 1 application topically daily.        No orders of the defined types were placed in this encounter.    Immunization History  Administered Date(s) Administered  . Influenza Split 09/25/2012  . Influenza,inj,Quad PF,36+ Mos 05/24/2013  . Pneumococcal  Polysaccharide-23 04/10/2009  . Tdap 01/21/2008    History reviewed. No pertinent family history.  History  Substance Use Topics  . Smoking status: Heavy Tobacco Smoker -- 1.50 packs/day    Types: Cigarettes  . Smokeless tobacco: Never Used  . Alcohol Use: 0.6 oz/week    1 Cans of beer per week     Comment: every now and again    Review of Systems  As noted in HPI  Filed Vitals:   09/18/13 1442  BP: 70/50  Pulse:   Temp:   Resp:     Physical Exam  Physical Exam  Constitutional: No distress.  Eyes: EOM are normal. Pupils are equal, round, and reactive to light.  No nystagmus  Cardiovascular: Normal rate and regular rhythm.   Pulmonary/Chest: He has no wheezes. He has no rales.    CBC    Component Value Date/Time   WBC 7.3 05/11/2013 1645   WBC 8.3 12/01/2011 1207   RBC 5.04 05/11/2013 1645   RBC 4.43* 12/01/2011 1207   HGB 16.4 05/11/2013 1645   HGB 14.6 12/01/2011 1207   HCT 46.2 05/11/2013 1645   HCT 44.2 12/01/2011 1207   PLT 212 05/11/2013 1645   MCV 91.7 05/11/2013 1645   MCV 99.7* 12/01/2011 1207   LYMPHSABS 2.3 05/11/2013 1645   MONOABS 0.7 05/11/2013 1645   EOSABS 0.2 05/11/2013 1645   BASOSABS 0.0 05/11/2013 1645    CMP     Component Value Date/Time   NA 140 03/28/2013 1143   K 4.9 03/28/2013 1143   CL 104 03/28/2013 1143   CO2 29 03/28/2013 1143   GLUCOSE 126* 03/28/2013 1143   BUN 13 03/28/2013 1143   CREATININE 0.78 03/28/2013 1143   CREATININE 0.59 09/25/2012 1611   CALCIUM 9.5 03/28/2013 1143   PROT 6.8 07/18/2013 1522   ALBUMIN 4.3 07/18/2013 1522   AST 24 07/18/2013 1522   ALT 31 07/18/2013 1522   ALKPHOS 72 07/18/2013 1522   BILITOT 0.4 07/18/2013 1522   GFRNONAA >90 09/25/2012 1611   GFRAA >90 09/25/2012 1611    Lab Results  Component Value Date/Time   CHOL 168 09/25/2012  4:11 PM    No components found with this basename: hga1c    Lab Results  Component Value Date/Time   AST 24 07/18/2013  3:22 PM    Assessment and Plan  Dizziness and  giddiness  Hypotension, unspecified  CAD (coronary artery disease)  Patient's blood pressure is quite low and he is symptomatic with dizziness, patient is advised to go to the ER for further evaluation and management.  Addendum  At 5 PM patient was called by CNA and was inquired that he has not gone to the emergency room, patient was again counseled and given clear instructions that right now he should go to the emergency room to be evaluated, patient agrees and will go to ER. Doris Cheadle, MD

## 2013-09-18 NOTE — ED Provider Notes (Signed)
CSN: [id number]     Arrival date & time 09/18/13  1831 History   First MD Initiated Contact with Patient 09/18/13 1843     Chief Complaint  Patient presents with  . Hypotension   (Consider location/radiation/quality/duration/timing/severity/associated sxs/prior Treatment) The history is provided by the patient.   patient was sent in by primary care Dr. for hypotension. Found to have pressures in the 70s and 80s. Patient states that feeling a little bad and lightheaded for the last 2 days. No fevers. No coughing. Abdominal pain. No chest pain. He has been treated with antibiotics for an infection on his right third toe. He states he saw his podiatrist and went to the doctor because his podiatrist told to recheck for diabetes. No dysuria. No urinary frequency. He states he may have taken an extra dose of his pain medicine today.  Past Medical History  Diagnosis Date  . CAD (coronary artery disease) 12/2007    AMI with stenting; retinal embolus 08/2005; stenting 03/2009  . Hyperlipidemia   . Hypertension   . OSA (obstructive sleep apnea)     not using CPAP-feels suffocating  . GAD (generalized anxiety disorder)   . Depression   . Chronic back pain     injuries 1998, 2008, 01/2009  . Peripheral neuropathy   . Hypogonadism male     can't afford testosterone   Past Surgical History  Procedure Laterality Date  . Bony pelvis surgery    . Bladder repair    . Fracture surgery    . Cardiac valve replacement     No family history on file. History  Substance Use Topics  . Smoking status: Heavy Tobacco Smoker -- 1.50 packs/day    Types: Cigarettes  . Smokeless tobacco: Never Used  . Alcohol Use: 0.6 oz/week    1 Cans of beer per week     Comment: every now and again    Review of Systems  Constitutional: Negative for activity change and appetite change.  Eyes: Negative for pain.  Respiratory: Negative for chest tightness and shortness of breath.   Cardiovascular: Negative for chest  pain and leg swelling.  Gastrointestinal: Negative for nausea, vomiting, abdominal pain and diarrhea.  Genitourinary: Negative for flank pain.  Musculoskeletal: Negative for back pain and neck stiffness.  Skin: Positive for wound. Negative for rash.  Neurological: Positive for light-headedness. Negative for weakness, numbness and headaches.  Psychiatric/Behavioral: Negative for behavioral problems.    Allergies  Review of patient's allergies indicates no known allergies.  Home Medications   Current Outpatient Rx  Name  Route  Sig  Dispense  Refill  . ALPRAZolam (XANAX) 1 MG tablet   Oral   Take 1 mg by mouth 3 (three) times daily as needed. For anxiety         . amitriptyline (ELAVIL) 100 MG tablet   Oral   Take 100 mg by mouth at bedtime.         Marland Kitchen. amLODipine (NORVASC) 5 MG tablet   Oral   Take 5 mg by mouth daily.         Marland Kitchen. amoxicillin-clavulanate (AUGMENTIN) 875-125 MG per tablet   Oral   Take 1 tablet by mouth 2 (two) times daily.   20 tablet   1   . aspirin EC 81 MG tablet   Oral   Take 81 mg by mouth daily.         Marland Kitchen. atorvastatin (LIPITOR) 40 MG tablet   Oral   Take 1 tablet (  40 mg total) by mouth daily.   30 tablet   6   . clindamycin (CLEOCIN) 150 MG capsule   Oral   Take 1 capsule (150 mg total) by mouth 3 (three) times daily.   30 capsule   1   . clopidogrel (PLAVIX) 75 MG tablet   Oral   Take 1 tablet (75 mg total) by mouth daily.   30 tablet   10   . furosemide (LASIX) 40 MG tablet   Oral   Take 40 mg by mouth daily as needed (swlling).          Marland Kitchen HYDROCHLOROTHIAZIDE PO   Oral   Take 1 tablet by mouth once.         . isosorbide mononitrate (IMDUR) 60 MG 24 hr tablet   Oral   Take 30 mg by mouth daily.         Marland Kitchen levofloxacin (LEVAQUIN) 750 MG tablet   Oral   Take 1 tablet (750 mg total) by mouth daily.   14 tablet   1   . lisinopril (PRINIVIL,ZESTRIL) 20 MG tablet      TAKE ONE (1) TABLET BY MOUTH EVERY DAY (MUST  KEEP APPOINTMENT)   30 tablet   3   . nebivolol (BYSTOLIC) 5 MG tablet   Oral   Take 1 tablet (5 mg total) by mouth daily.   28 tablet   0   . nitroGLYCERIN (NITROSTAT) 0.4 MG SL tablet   Sublingual   Place 0.4 mg under the tongue every 5 (five) minutes as needed for chest pain.         Marland Kitchen oxyCODONE (OXYCONTIN) 20 MG 12 hr tablet   Oral   Take 20 mg by mouth every 12 (twelve) hours.         Marland Kitchen oxyCODONE (ROXICODONE) 15 MG immediate release tablet   Oral   Take 15 mg by mouth every 4 (four) hours.         . pantoprazole (PROTONIX) 40 MG tablet   Oral   Take 1 tablet (40 mg total) by mouth daily.   30 tablet   3   . PARoxetine (PAXIL) 20 MG tablet   Oral   Take 20 mg by mouth every morning.         . pregabalin (LYRICA) 100 MG capsule   Oral   Take 100 mg by mouth 3 (three) times daily.         . silver sulfADIAZINE (SILVADENE) 1 % cream   Topical   Apply 1 application topically daily.   50 g   0    BP 78/48  Pulse 81  Temp(Src) 97.7 F (36.5 C) (Oral)  Resp 20  Wt 229 lb (103.874 kg)  SpO2 100% Physical Exam  Nursing note and vitals reviewed. Constitutional: He is oriented to person, place, and time. He appears well-developed and well-nourished.  HENT:  Head: Normocephalic and atraumatic.  Eyes: EOM are normal. Pupils are equal, round, and reactive to light.  Neck: Normal range of motion. Neck supple.  Cardiovascular: Normal rate, regular rhythm and normal heart sounds.   No murmur heard. Pulmonary/Chest: Effort normal and breath sounds normal.  Abdominal: Soft. Bowel sounds are normal. He exhibits no distension and no mass. There is no tenderness. There is no rebound and no guarding.  Musculoskeletal: Normal range of motion. He exhibits tenderness. He exhibits no edema.  Right third and fourth toe with chronic changes. Decreased capillary refill. Skin changes of infection.  Decreased sensation over her foot somewhat diffusely.  Neurological: He is  alert and oriented to person, place, and time. No cranial nerve deficit.  Skin: Skin is warm and dry.  Psychiatric: He has a normal mood and affect.    ED Course  Procedures (including critical care time) Labs Review Labs Reviewed - No data to display Imaging Review No results found.  EKG Interpretation   None       MDM  No diagnosis found. Patient with hypotension. May be partially related to extra pain pill. Also has mildly increased creatinine. Lactate is minimally elevated. He is likely osteomyelitis of the right third toe. He's been on some antibiotics. Blood pressures improved after 2 L of IV fluids but still mildly decreased. Patient is overall well-appearing. We'll do to internal medicine. Patient required repeat fluid boluses for his hypotension.  CRITICAL CARE Performed by: Billee Cashing Total critical care time: 30 Critical care time was exclusive of separately billable procedures and treating other patients. Critical care was necessary to treat or prevent imminent or life-threatening deterioration. Critical care was time spent personally by me on the following activities: development of treatment plan with patient and/or surrogate as well as nursing, discussions with consultants, evaluation of patient's response to treatment, examination of patient, obtaining history from patient or surrogate, ordering and performing treatments and interventions, ordering and review of laboratory studies, ordering and review of radiographic studies, pulse oximetry and re-evaluation of patient's condition.    Juliet Rude. Rubin Payor, MD 09/18/13 2204

## 2013-09-18 NOTE — ED Notes (Signed)
Pt in after going to his PMD office for an unrelated visit and was told his BP was very low, pt states he has had intermittent episodes of dizziness over the last few days, BP 78/48 in triage

## 2013-09-19 ENCOUNTER — Inpatient Hospital Stay (HOSPITAL_COMMUNITY): Payer: Medicare Other

## 2013-09-19 DIAGNOSIS — M869 Osteomyelitis, unspecified: Secondary | ICD-10-CM

## 2013-09-19 LAB — URINALYSIS, ROUTINE W REFLEX MICROSCOPIC
Bilirubin Urine: NEGATIVE
Glucose, UA: NEGATIVE mg/dL
Hgb urine dipstick: NEGATIVE
KETONES UR: NEGATIVE mg/dL
LEUKOCYTES UA: NEGATIVE
Nitrite: NEGATIVE
PROTEIN: NEGATIVE mg/dL
Specific Gravity, Urine: 1.008 (ref 1.005–1.030)
UROBILINOGEN UA: 0.2 mg/dL (ref 0.0–1.0)
pH: 6.5 (ref 5.0–8.0)

## 2013-09-19 LAB — CBC WITH DIFFERENTIAL/PLATELET
Basophils Absolute: 0.1 10*3/uL (ref 0.0–0.1)
Basophils Relative: 1 % (ref 0–1)
EOS PCT: 3 % (ref 0–5)
Eosinophils Absolute: 0.3 10*3/uL (ref 0.0–0.7)
HEMATOCRIT: 35.4 % — AB (ref 39.0–52.0)
Hemoglobin: 12.4 g/dL — ABNORMAL LOW (ref 13.0–17.0)
LYMPHS PCT: 36 % (ref 12–46)
Lymphs Abs: 2.9 10*3/uL (ref 0.7–4.0)
MCH: 30.8 pg (ref 26.0–34.0)
MCHC: 35 g/dL (ref 30.0–36.0)
MCV: 88.1 fL (ref 78.0–100.0)
MONO ABS: 0.6 10*3/uL (ref 0.1–1.0)
MONOS PCT: 8 % (ref 3–12)
Neutro Abs: 4.2 10*3/uL (ref 1.7–7.7)
Neutrophils Relative %: 53 % (ref 43–77)
Platelets: 163 10*3/uL (ref 150–400)
RBC: 4.02 MIL/uL — AB (ref 4.22–5.81)
RDW: 13.5 % (ref 11.5–15.5)
WBC: 8.1 10*3/uL (ref 4.0–10.5)

## 2013-09-19 LAB — COMPREHENSIVE METABOLIC PANEL
ALBUMIN: 3 g/dL — AB (ref 3.5–5.2)
ALK PHOS: 81 U/L (ref 39–117)
ALT: 17 U/L (ref 0–53)
AST: 17 U/L (ref 0–37)
BUN: 22 mg/dL (ref 6–23)
CO2: 21 mEq/L (ref 19–32)
Calcium: 8.3 mg/dL — ABNORMAL LOW (ref 8.4–10.5)
Chloride: 103 mEq/L (ref 96–112)
Creatinine, Ser: 1.19 mg/dL (ref 0.50–1.35)
GFR calc Af Amer: 78 mL/min — ABNORMAL LOW (ref >90)
GFR calc non Af Amer: 68 mL/min — ABNORMAL LOW (ref >90)
Glucose, Bld: 107 mg/dL — ABNORMAL HIGH (ref 70–99)
POTASSIUM: 3.8 meq/L (ref 3.7–5.3)
SODIUM: 139 meq/L (ref 137–147)
TOTAL PROTEIN: 6.3 g/dL (ref 6.0–8.3)
Total Bilirubin: 0.2 mg/dL — ABNORMAL LOW (ref 0.3–1.2)

## 2013-09-19 LAB — RAPID URINE DRUG SCREEN, HOSP PERFORMED
AMPHETAMINES: NOT DETECTED
Barbiturates: NOT DETECTED
Benzodiazepines: NOT DETECTED
Cocaine: NOT DETECTED
Opiates: NOT DETECTED
Tetrahydrocannabinol: POSITIVE — AB

## 2013-09-19 LAB — LACTIC ACID, PLASMA: Lactic Acid, Venous: 1 mmol/L (ref 0.5–2.2)

## 2013-09-19 LAB — MRSA PCR SCREENING: MRSA BY PCR: NEGATIVE

## 2013-09-19 LAB — TSH: TSH: 1.603 u[IU]/mL (ref 0.350–4.500)

## 2013-09-19 LAB — CORTISOL: Cortisol, Plasma: 3.7 ug/dL

## 2013-09-19 LAB — TROPONIN I: Troponin I: <0.3 ng/mL (ref <0.30)

## 2013-09-19 MED ORDER — DEXTROSE 5 % IV SOLN
2.0000 g | Freq: Three times a day (TID) | INTRAVENOUS | Status: DC
  Administered 2013-09-19 – 2013-09-20 (×3): 2 g via INTRAVENOUS
  Filled 2013-09-19 (×4): qty 2

## 2013-09-19 MED ORDER — OXYCODONE HCL 5 MG PO TABS: 15.0000 mg | ORAL_TABLET | ORAL | Status: DC

## 2013-09-19 MED ORDER — ENOXAPARIN SODIUM 40 MG/0.4ML ~~LOC~~ SOLN
40.0000 mg | SUBCUTANEOUS | Status: DC
  Administered 2013-09-19: 40 mg via SUBCUTANEOUS
  Filled 2013-09-19 (×2): qty 0.4

## 2013-09-19 MED ORDER — VANCOMYCIN HCL 10 G IV SOLR
1250.0000 mg | Freq: Two times a day (BID) | INTRAVENOUS | Status: DC
  Administered 2013-09-19 – 2013-09-20 (×3): 1250 mg via INTRAVENOUS
  Filled 2013-09-19 (×4): qty 1250

## 2013-09-19 MED ORDER — NICOTINE 21 MG/24HR TD PT24
21.0000 mg | MEDICATED_PATCH | Freq: Every day | TRANSDERMAL | Status: DC
  Administered 2013-09-19 – 2013-09-20 (×2): 21 mg via TRANSDERMAL
  Filled 2013-09-19 (×3): qty 1

## 2013-09-19 MED ORDER — OXYCODONE HCL 20 MG PO TB12: 40.0000 mg | ORAL_TABLET | Freq: Two times a day (BID) | ORAL | Status: DC

## 2013-09-19 MED ORDER — SODIUM CHLORIDE 0.9 % IJ SOLN: 10.0000 mL | INTRAMUSCULAR | Status: DC | PRN

## 2013-09-19 NOTE — Consult Note (Signed)
I have seen the patient and agree with the consult note.  Given his current health status and the chronic nature of his foot problem, I do not feel he needs a surgical intervention right now.  He will likely need a 3rd toe amputation in the near future, but this can be arranged as an outptient once his current medical status improves.

## 2013-09-19 NOTE — Progress Notes (Signed)
ANTIBIOTIC CONSULT NOTE - INITIAL  Pharmacy Consult for Vancomycin  Indication: R-third toe osteomyelitis  No Known Allergies  Patient Measurements: Height: 5\' 10"  (177.8 cm) Weight: 228 lb 6.3 oz (103.6 kg) IBW/kg (Calculated) : 73  Vital Signs: Temp: 97.8 F (36.6 C) (02/05 0000) Temp src: Oral (02/05 0000) BP: 114/54 mmHg (02/05 0000) Pulse Rate: 55 (02/05 0000)  Labs:  Recent Labs  09/18/13 1859  WBC 10.5  HGB 14.3  PLT 195  CREATININE 1.68*   Estimated Creatinine Clearance: 60.6 ml/min (by C-G formula based on Cr of 1.68).  Medical History: Past Medical History  Diagnosis Date  . CAD (coronary artery disease) 12/2007    AMI with stenting; retinal embolus 08/2005; stenting 03/2009  . Hyperlipidemia   . Hypertension   . OSA (obstructive sleep apnea)     not using CPAP-feels suffocating  . GAD (generalized anxiety disorder)   . Depression   . Chronic back pain     injuries 1998, 2008, 01/2009  . Peripheral neuropathy   . Hypogonadism male     can't afford testosterone  . Anxiety   . Stroke   . Anginal pain   . Myocardial infarction     Medications:  Levaquin/Clindamycin PTA  Assessment: 53 y/o M with right 2cd and 3rd toe ulcers, xray consistent with osteomyelitis of the 3rd toe. WBC 10.5, slight bump in Scr at 1.68, other labs as above.   ED Antibiotics  Vancomycin 1000 mg IV x 1 at 2102  Goal of Therapy:  Vancomycin trough level 15-20 mcg/ml  Plan:  -Vancomycin 1250 mg IV q12h -Trend WBC, temp, renal function  -Drug levels as indicated  -May need dose adjustments with changes in renal function   Abran DukeLedford, Aina Rossbach 09/19/2013,12:34 AM

## 2013-09-19 NOTE — Care Management Note (Addendum)
    Page 1 of 2   09/20/2013     2:16:50 PM   CARE MANAGEMENT NOTE 09/20/2013  Patient:  Leonard KettleSMITH,Najeeb ALAN   Account Number:  [id number]  Date Initiated:  09/19/2013  Documentation initiated by:  Junius CreamerWELL,DEBBIE  Subjective/Objective Assessment:   adm w hypotension     Action/Plan:   lives alone, pcp dr Hyman Hopesjegede   Anticipated DC Date:  09/20/2013   Anticipated DC Plan:  HOME W HOME HEALTH SERVICES      DC Planning Services  CM consult      [facility]   Choice offered to / List presented to:  C-1 Patient        HH arranged  HH-1 RN  HH-4 NURSE'S AIDE      HH agency  [facility]   Status of service:  Completed, signed off Medicare Important Message given?   (If response is "NO", the following Medicare IM given date fields will be blank) Date Medicare IM given:   Date Additional Medicare IM given:    Discharge Disposition:  HOME W HOME HEALTH SERVICES  Per UR Regulation:  Reviewed for med. necessity/level of care/duration of stay  If discussed at Long Length of Stay Meetings, dates discussed:    Comments:  09/20/13 Rosalita ChessmanJULIE Anselmo Reihl,RN,BSN [phone] PT FOR DC HOME TODAY.  NEEDS HHRN FOLLOW UP FOR WOUND CARE, HH AIDE.  SPOKE WITH PT:  [facility]; START OF CARE 24-48H POST DC DATE.  2/5 1452 debbie dowell rn,bsn pt has medicaid for meds. his copay are 1.00 to 3.00.

## 2013-09-19 NOTE — Progress Notes (Addendum)
[facility] Progress Note [place] TEAM 1 - Stepdown/ICU TEAM   Leonard Murray ZOX:[id number] DOB: [birth year] DOA: 09/18/2013 PCP: Jeanann Lewandowsky, MD  Brief narrative: Leonard Murray is a 53 y.o. male with history of CAD status post stenting, hypertension, hyperlipidemia, OSA, chronic pain, anxiety disorder who was referred to the ER after patient was found to be hypotensive at his primary care's office. Patient states that a month ago he has started developing some ulcers on his right foot second toe.  Over the last 3 weeks he noticed that his third toe on the right foot has become increasingly macerated. He was started on empiric antibiotics by his podiatrist. His podiatrist has advised him to follow with his primary care to check for diabetes and Parkinson's. Upon being evaluated by the PCP, he was noted to by hypotensive and sent to the ER. Xray of his foot reveals possible osteomyelitis of 3rd toe right foot.  Pt admitted to feeling dizzy and having sweats starting 24 hrs prior to presentation. He had similar episode in 2013 with hypotension where he had extensive workup including ACTH stimulation test all of which were unremarkable as per the records. 2-D echo done at that time were showing EF of 55-60%. He also states that over the last 6 months he has been having increasing tremors of his both upper extremities. He has stopped drinking alcohol over the last one week.     Subjective: Significant pain "all over". On chronic narcotics- some have not been resumed. No other complaints but would like the toe removed sooner rather than later.   Assessment/Plan: Principal Problem:   Hypotension/ ARF (acute renal failure) - suspect mostly due to dehydration - cont IVF for 24 hrs more  Active Problems:    Osteomyelitis- 3rd right toe- distal phalanx  - Add Cefepime to Vanc- request PICC line for long term antibiotics - appreciate ortho consult    CAD (coronary artery  disease) - cont ASA - have reordered Coreg with holding parameters    Hyperlipidemia - cont statin    Chronic back pain - resume home meds and follow BP in SDU   Code Status: full code Family Communication: none Disposition Plan: tx to med/surg and home eventually  Consultants: ortho  Procedures: none  Antibiotics: Vanc 2/4 Cefepime 2/5  DVT prophylaxis: Lovenox  Objective: Blood pressure 95/48, pulse 60, temperature 97.9 F (36.6 C), temperature source Oral, resp. rate 14, height 5\' 10"  (1.778 m), weight 103.6 kg (228 lb 6.3 oz), SpO2 96.00%.  Intake/Output Summary (Last 24 hours) at 09/19/13 1547 Last data filed at 09/19/13 1100  Gross per 24 hour  Intake 406.25 ml  Output   3100 ml  Net -2693.75 ml     Exam: General: No acute respiratory distress Lungs: Clear to auscultation bilaterally without wheezes or crackles Cardiovascular: Regular rate and rhythm without murmur gallop or rub normal S1 and S2 Abdomen: Nontender, nondistended, soft, bowel sounds positive, no rebound, no ascites, no appreciable mass Extremities: No significant cyanosis, clubbing, or edema bilateral lower extremities Skin: right 2nd and 3rd toe erythematous and swollen, 3rd toe macerated on plantar aspect with drainage.    Data Reviewed: Basic Metabolic Panel:  Recent Labs Lab 09/18/13 1859 09/19/13 0302  NA 139 139  K 4.0 3.8  CL 100 103  CO2 23 21  GLUCOSE 103* 107*  BUN 26* 22  CREATININE 1.68* 1.19  CALCIUM 9.3 8.3*   Liver Function Tests:  Recent Labs Lab 09/18/13 1859 09/19/13  0302  AST 18 17  ALT 19 17  ALKPHOS 92 81  BILITOT 0.5 0.2*  PROT 7.7 6.3  ALBUMIN 3.8 3.0*   No results found for this basename: LIPASE, AMYLASE,  in the last 168 hours No results found for this basename: AMMONIA,  in the last 168 hours CBC:  Recent Labs Lab 09/18/13 1859 09/19/13 0302  WBC 10.5 8.1  NEUTROABS 5.8 4.2  HGB 14.3 12.4*  HCT 40.3 35.4*  MCV 87.6 88.1  PLT 195  163   Cardiac Enzymes:  Recent Labs Lab 09/18/13 0320 09/18/13 1859 09/19/13 0915  TROPONINI <0.30 <0.30 <0.30   BNP (last 3 results) No results found for this basename: PROBNP,  in the last 8760 hours CBG: No results found for this basename: GLUCAP,  in the last 168 hours  Recent Results (from the past 240 hour(s))  MRSA PCR SCREENING     Status: None   Collection Time    09/18/13 11:17 PM      Result Value Range Status   MRSA by PCR NEGATIVE  NEGATIVE Final   Comment:            The GeneXpert MRSA Assay (FDA     approved for NASAL specimens     only), is one component of a     comprehensive MRSA colonization     surveillance program. It is not     intended to diagnose MRSA     infection nor to guide or     monitor treatment for     MRSA infections.     Studies:  Recent x-ray studies have been reviewed in detail by the Attending Physician  Scheduled Meds:  Scheduled Meds: . amitriptyline  100 mg Oral QHS  . aspirin EC  81 mg Oral Daily  . atorvastatin  40 mg Oral Daily  . ceFEPime (MAXIPIME) IV  2 g Intravenous Q8H  . clopidogrel  75 mg Oral Q breakfast  . folic acid  1 mg Oral Daily  . multivitamin with minerals  1 tablet Oral Daily  . nicotine  21 mg Transdermal Daily  . OxyCODONE  40 mg Oral Q12H  . pantoprazole  40 mg Oral Daily  . PARoxetine  20 mg Oral BH-q7a  . pneumococcal 23 valent vaccine  0.5 mL Intramuscular Tomorrow-1000  . pregabalin  100 mg Oral TID  . sodium chloride  3 mL Intravenous Q12H  . thiamine  100 mg Oral Daily   Or  . thiamine  100 mg Intravenous Daily  . vancomycin  1,250 mg Intravenous Q12H   Continuous Infusions: . sodium chloride 125 mL/hr at 09/19/13 1218    Time spent on care of this patient: >35 min   Calvert CantorIZWAN,Cataleya Cristina, MD  [facility] Office  [phone] Pager - Text Page per Loretha StaplerAmion as per below:  On-Call/Text Page:      [email or web address]      password [email or web address]  If 7PM-7AM, please contact  night-coverage [email or web address] Password [email or web address] 09/19/2013, 3:47 PM   LOS: 1 day

## 2013-09-19 NOTE — Progress Notes (Signed)
Orthopedic Tech Progress Note Patient Details:  Leonard KettleDavid Alan Murray 04/27/1961 [id number] Darco shoe fit for patient size and comfort. Application tolerated well. Patient elected to not have shoe on while in bed. Darco shoe placed in reach on nightstand by bed.  Ortho Devices Type of Ortho Device: Darco shoe Ortho Device/Splint Location: Right LE Ortho Device/Splint Interventions: Ordered;Application   Asia R Thompson 09/19/2013, 1:45 PM

## 2013-09-19 NOTE — Consult Note (Signed)
Reason for Consult:Right 2nd and 3rd toe infections Referring Physician: [name] is an 53 y.o. male.  HPI: Patient reports seeing a podiatrist for the past moth due to ulceration of hi 2nd and 3rd toe ulcerations. States 2nd toe has been responding well to oral antibiotics. Third toe became worse after the use of toe loop. Denies fevers, chills, Diabetes mellitus. He does smoke.  Past Medical History  Diagnosis Date  . CAD (coronary artery disease) 12/2007    AMI with stenting; retinal embolus 08/2005; stenting 03/2009  . Hyperlipidemia   . Hypertension   . OSA (obstructive sleep apnea)     not using CPAP-feels suffocating  . GAD (generalized anxiety disorder)   . Depression   . Chronic back pain     injuries 1998, 2008, 01/2009  . Peripheral neuropathy   . Hypogonadism male     can't afford testosterone  . Anxiety   . Stroke   . Anginal pain   . Myocardial infarction     Past Surgical History  Procedure Laterality Date  . Bony pelvis surgery    . Bladder repair    . Fracture surgery    . Cardiac catheterization    . Coronary angioplasty    . Neck surgery      Family History  Problem Relation Age of Onset  . Diabetes Mellitus II Mother   . Diabetes Mellitus II Father     Social History:  reports that he has been smoking Cigarettes.  He has a 60 pack-year smoking history. He has never used smokeless tobacco. He reports that he drinks about 0.6 ounces of alcohol per week. He reports that he does not use illicit drugs.  Allergies: No Known Allergies  Medications: I have reviewed the patient's current medications.  Results for orders placed during the hospital encounter of 09/18/13 (from the past 48 hour(s))  TROPONIN I     Status: None   Collection Time    09/18/13  3:20 AM      Result Value Range   Troponin I <0.30  <0.30 ng/mL   Comment:            Due to the release kinetics of cTnI,     a negative result within the first hours     of the  onset of symptoms does not rule out     myocardial infarction with certainty.     If myocardial infarction is still suspected,     repeat the test at appropriate intervals.  CBC WITH DIFFERENTIAL     Status: None   Collection Time    09/18/13  6:59 PM      Result Value Range   WBC 10.5  4.0 - 10.5 K/uL   RBC 4.60  4.22 - 5.81 MIL/uL   Hemoglobin 14.3  13.0 - 17.0 g/dL   HCT 03.0  56.5 - 49.9 %   MCV 87.6  78.0 - 100.0 fL   MCH 31.1  26.0 - 34.0 pg   MCHC 35.5  30.0 - 36.0 g/dL   RDW 67.9  80.9 - 13.8 %   Platelets 195  150 - 400 K/uL   Neutrophils Relative % 55  43 - 77 %   Neutro Abs 5.8  1.7 - 7.7 K/uL   Lymphocytes Relative 35  12 - 46 %   Lymphs Abs 3.7  0.7 - 4.0 K/uL   Monocytes Relative 8  3 - 12 %   Monocytes Absolute 0.8  0.1 - 1.0 K/uL   Eosinophils Relative 2  0 - 5 %   Eosinophils Absolute 0.2  0.0 - 0.7 K/uL   Basophils Relative 0  0 - 1 %   Basophils Absolute 0.0  0.0 - 0.1 K/uL  COMPREHENSIVE METABOLIC PANEL     Status: Abnormal   Collection Time    09/18/13  6:59 PM      Result Value Range   Sodium 139  137 - 147 mEq/L   Potassium 4.0  3.7 - 5.3 mEq/L   Chloride 100  96 - 112 mEq/L   CO2 23  19 - 32 mEq/L   Glucose, Bld 103 (*) 70 - 99 mg/dL   BUN 26 (*) 6 - 23 mg/dL   Creatinine, Ser 1.68 (*) 0.50 - 1.35 mg/dL   Calcium 9.3  8.4 - 10.5 mg/dL   Total Protein 7.7  6.0 - 8.3 g/dL   Albumin 3.8  3.5 - 5.2 g/dL   AST 18  0 - 37 U/L   ALT 19  0 - 53 U/L   Alkaline Phosphatase 92  39 - 117 U/L   Total Bilirubin 0.5  0.3 - 1.2 mg/dL   GFR calc non Af Amer 45 (*) >90 mL/min   GFR calc Af Amer 52 (*) >90 mL/min   Comment: (NOTE)     The eGFR has been calculated using the CKD EPI equation.     This calculation has not been validated in all clinical situations.     eGFR's persistently <90 mL/min signify possible Chronic Kidney     Disease.  TROPONIN I     Status: None   Collection Time    09/18/13  6:59 PM      Result Value Range   Troponin I <0.30  <0.30  ng/mL   Comment:            Due to the release kinetics of cTnI,     a negative result within the first hours     of the onset of symptoms does not rule out     myocardial infarction with certainty.     If myocardial infarction is still suspected,     repeat the test at appropriate intervals.  HEMOGLOBIN A1C     Status: None   Collection Time    09/18/13  6:59 PM      Result Value Range   Hemoglobin A1C 5.6  <5.7 %   Comment: (NOTE)                                                                               According to the ADA Clinical Practice Recommendations for 2011, when     HbA1c is used as a screening test:      >=6.5%   Diagnostic of Diabetes Mellitus               (if abnormal result is confirmed)     5.7-6.4%   Increased risk of developing Diabetes Mellitus     References:Diagnosis and Classification of Diabetes Mellitus,Diabetes     ZOXW,9604,54(UJWJX 1):S62-S69 and Standards of Medical Care in  Diabetes - 2011,Diabetes Care,2011,34 (Suppl 1):S11-S61.   Mean Plasma Glucose 114  <117 mg/dL   Comment: Performed at [facility] (LACTIC ACID)     Status: Abnormal   Collection Time    09/18/13  7:21 PM      Result Value Range   Lactic Acid, Venous 2.53 (*) 0.5 - 2.2 mmol/L  TSH     Status: None   Collection Time    09/18/13 11:00 PM      Result Value Range   TSH 1.603  0.350 - 4.500 uIU/mL   Comment: Performed at [place]     Status: None   Collection Time    09/18/13 11:00 PM      Result Value Range   Cortisol, Plasma 3.7     Comment: (NOTE)     AM:  4.3 - 22.4 ug/dL     PM:  3.1 - 16.7 ug/dL     Performed at [facility]  MRSA PCR SCREENING     Status: None   Collection Time    09/18/13 11:17 PM      Result Value Range   MRSA by PCR NEGATIVE  NEGATIVE   Comment:            The GeneXpert MRSA Assay (FDA     approved for NASAL specimens     only), is one component of a     comprehensive MRSA  colonization     surveillance program. It is not     intended to diagnose MRSA     infection nor to guide or     monitor treatment for     MRSA infections.  COMPREHENSIVE METABOLIC PANEL     Status: Abnormal   Collection Time    09/19/13  3:02 AM      Result Value Range   Sodium 139  137 - 147 mEq/L   Potassium 3.8  3.7 - 5.3 mEq/L   Chloride 103  96 - 112 mEq/L   CO2 21  19 - 32 mEq/L   Glucose, Bld 107 (*) 70 - 99 mg/dL   BUN 22  6 - 23 mg/dL   Creatinine, Ser 1.19  0.50 - 1.35 mg/dL   Calcium 8.3 (*) 8.4 - 10.5 mg/dL   Total Protein 6.3  6.0 - 8.3 g/dL   Albumin 3.0 (*) 3.5 - 5.2 g/dL   AST 17  0 - 37 U/L   ALT 17  0 - 53 U/L   Alkaline Phosphatase 81  39 - 117 U/L   Total Bilirubin 0.2 (*) 0.3 - 1.2 mg/dL   GFR calc non Af Amer 68 (*) >90 mL/min   GFR calc Af Amer 78 (*) >90 mL/min   Comment: (NOTE)     The eGFR has been calculated using the CKD EPI equation.     This calculation has not been validated in all clinical situations.     eGFR's persistently <90 mL/min signify possible Chronic Kidney     Disease.  CBC WITH DIFFERENTIAL     Status: Abnormal   Collection Time    09/19/13  3:02 AM      Result Value Range   WBC 8.1  4.0 - 10.5 K/uL   RBC 4.02 (*) 4.22 - 5.81 MIL/uL   Hemoglobin 12.4 (*) 13.0 - 17.0 g/dL   HCT 35.4 (*) 39.0 - 52.0 %   MCV 88.1  78.0 - 100.0 fL   MCH  30.8  26.0 - 34.0 pg   MCHC 35.0  30.0 - 36.0 g/dL   RDW 13.5  11.5 - 15.5 %   Platelets 163  150 - 400 K/uL   Neutrophils Relative % 53  43 - 77 %   Neutro Abs 4.2  1.7 - 7.7 K/uL   Lymphocytes Relative 36  12 - 46 %   Lymphs Abs 2.9  0.7 - 4.0 K/uL   Monocytes Relative 8  3 - 12 %   Monocytes Absolute 0.6  0.1 - 1.0 K/uL   Eosinophils Relative 3  0 - 5 %   Eosinophils Absolute 0.3  0.0 - 0.7 K/uL   Basophils Relative 1  0 - 1 %   Basophils Absolute 0.1  0.0 - 0.1 K/uL  LACTIC ACID, PLASMA     Status: None   Collection Time    09/19/13  3:02 AM      Result Value Range   Lactic Acid,  Venous 1.0  0.5 - 2.2 mmol/L  URINALYSIS, ROUTINE W REFLEX MICROSCOPIC     Status: None   Collection Time    09/19/13  4:50 AM      Result Value Range   Color, Urine YELLOW  YELLOW   APPearance CLEAR  CLEAR   Specific Gravity, Urine 1.008  1.005 - 1.030   pH 6.5  5.0 - 8.0   Glucose, UA NEGATIVE  NEGATIVE mg/dL   Hgb urine dipstick NEGATIVE  NEGATIVE   Bilirubin Urine NEGATIVE  NEGATIVE   Ketones, ur NEGATIVE  NEGATIVE mg/dL   Protein, ur NEGATIVE  NEGATIVE mg/dL   Urobilinogen, UA 0.2  0.0 - 1.0 mg/dL   Nitrite NEGATIVE  NEGATIVE   Leukocytes, UA NEGATIVE  NEGATIVE   Comment: MICROSCOPIC NOT DONE ON URINES WITH NEGATIVE PROTEIN, BLOOD, LEUKOCYTES, NITRITE, OR GLUCOSE <1000 mg/dL.  URINE RAPID DRUG SCREEN (HOSP PERFORMED)     Status: Abnormal   Collection Time    09/19/13  4:50 AM      Result Value Range   Opiates NONE DETECTED  NONE DETECTED   Cocaine NONE DETECTED  NONE DETECTED   Benzodiazepines NONE DETECTED  NONE DETECTED   Amphetamines NONE DETECTED  NONE DETECTED   Tetrahydrocannabinol POSITIVE (*) NONE DETECTED   Barbiturates NONE DETECTED  NONE DETECTED   Comment:            DRUG SCREEN FOR MEDICAL PURPOSES     ONLY.  IF CONFIRMATION IS NEEDED     FOR ANY PURPOSE, NOTIFY LAB     WITHIN 5 DAYS.                LOWEST DETECTABLE LIMITS     FOR URINE DRUG SCREEN     Drug Class       Cutoff (ng/mL)     Amphetamine      1000     Barbiturate      200     Benzodiazepine   834     Tricyclics       196     Opiates          300     Cocaine          300     THC              50  TROPONIN I     Status: None   Collection Time    09/19/13  9:15 AM      Result Value Range  Troponin I <0.30  <0.30 ng/mL   Comment:            Due to the release kinetics of cTnI,     a negative result within the first hours     of the onset of symptoms does not rule out     myocardial infarction with certainty.     If myocardial infarction is still suspected,     repeat the test at  appropriate intervals.    Dg Chest 2 View  09/18/2013   CLINICAL DATA:  Hypotension  EXAM: CHEST  2 VIEW  COMPARISON:  June 16, 2012  FINDINGS: There is no edema or consolidation. Heart size and pulmonary vascularity are normal. No adenopathy. No bone lesions.  IMPRESSION: No edema or consolidation.   Electronically Signed   By: Lowella Grip M.D.   On: 09/18/2013 20:59   Dg Toe 3rd Right  09/18/2013   CLINICAL DATA:  Ulcer with open wound  EXAM: RIGHT THIRD TOE  COMPARISON:  May 30, 2013  FINDINGS: Frontal, oblique, and lateral views were obtained. There is evidence of a degree of bony destruction in the third distal phalanx. There is no well-defined fracture or dislocation. Joint spaces appear intact.  IMPRESSION: Evidence of bony destruction involving much of the third distal phalanx. No acute fracture or dislocation.   Electronically Signed   By: Lowella Grip M.D.   On: 09/18/2013 21:03    Review of Systems  Constitutional: Negative for fever, chills and malaise/fatigue.  Respiratory: Negative.   Musculoskeletal:       Ulceration right 2nd and 3rd toes Chronic back pain  Neurological: Positive for tremors.       Notes bilateral lower extremities with neuropathy secondary to back injury   Blood pressure 115/79, pulse 60, temperature 97.2 F (36.2 C), temperature source Oral, resp. rate 14, height $RemoveBe'5\' 10"'HoPHwXTCf$  (1.778 m), weight 103.6 kg (228 lb 6.3 oz), SpO2 96.00%. Physical Exam  Constitutional: He is oriented to person, place, and time. He appears well-developed and well-nourished.  HENT:  Head: Normocephalic and atraumatic.  Eyes: EOM are normal.  Cardiovascular: Intact distal pulses.   Respiratory: Effort normal.  Musculoskeletal:  Right foot lesser toes with claw toe deformities.  Neurological: He is alert and oriented to person, place, and time.  Decreased sensation bilateral feet to light touch.  Skin:  Ulcerations right 2nd and 3rd toe pads With plantar aspect of  third toe with significant erythema and maceration. No express able purulence from either toe. Maceration 1 st and 2nd webspace right foot.  Psychiatric: He has a normal mood and affect. His behavior is normal.    Assessment/Plan: Recommend daily soaking of right foot in dial soap soaks. Xeroform dressing application daily to ulcerations. No signs of sepsis currently Osteomyelitis radiographically of third to will most likley require 3rd toe amputation, this could be done as an out patient.  Continue Vancomycin will need oral antibiotics at discharge. Weight bearing as tolerated right foot in Darco shoe. Thanks for consult will continue to follow patient during this hospitalization.    Erskine Emery 09/19/2013, 11:54 AM

## 2013-09-19 NOTE — Progress Notes (Signed)
ANTIBIOTIC CONSULT NOTE - INITIAL  Pharmacy Consult for cefepime Indication: osteomyelitis  No Known Allergies  Patient Measurements: Height: 5\' 10"  (177.8 cm) Weight: 228 lb 6.3 oz (103.6 kg) IBW/kg (Calculated) : 73   Vital Signs: Temp: 97.9 F (36.6 C) (02/05 1200) Temp src: Oral (02/05 1200) BP: 95/48 mmHg (02/05 1200) Pulse Rate: 60 (02/05 0900) Intake/Output from previous day: 02/04 0701 - 02/05 0700 In: 406.3 [I.V.:406.3] Out: 2100 [Urine:2100] Intake/Output from this shift: Total I/O In: -  Out: 1000 [Urine:1000]  Labs:  Recent Labs  09/18/13 1859 09/19/13 0302  WBC 10.5 8.1  HGB 14.3 12.4*  PLT 195 163  CREATININE 1.68* 1.19   Estimated Creatinine Clearance: 85.5 ml/min (by C-G formula based on Cr of 1.19). No results found for this basename: VANCOTROUGH, Leodis BinetVANCOPEAK, VANCORANDOM, GENTTROUGH, GENTPEAK, GENTRANDOM, TOBRATROUGH, TOBRAPEAK, TOBRARND, AMIKACINPEAK, AMIKACINTROU, AMIKACIN,  in the last 72 hours   Microbiology: Recent Results (from the past 720 hour(s))  MRSA PCR SCREENING     Status: None   Collection Time    09/18/13 11:17 PM      Result Value Range Status   MRSA by PCR NEGATIVE  NEGATIVE Final   Comment:            The GeneXpert MRSA Assay (FDA     approved for NASAL specimens     only), is one component of a     comprehensive MRSA colonization     surveillance program. It is not     intended to diagnose MRSA     infection nor to guide or     monitor treatment for     MRSA infections.    Medical History: Past Medical History  Diagnosis Date  . CAD (coronary artery disease) 12/2007    AMI with stenting; retinal embolus 08/2005; stenting 03/2009  . Hyperlipidemia   . Hypertension   . OSA (obstructive sleep apnea)     not using CPAP-feels suffocating  . GAD (generalized anxiety disorder)   . Depression   . Chronic back pain     injuries 1998, 2008, 01/2009  . Peripheral neuropathy   . Hypogonadism male     can't afford  testosterone  . Anxiety   . Stroke   . Anginal pain   . Myocardial infarction     Medications:  Scheduled:  . amitriptyline  100 mg Oral QHS  . aspirin EC  81 mg Oral Daily  . atorvastatin  40 mg Oral Daily  . clopidogrel  75 mg Oral Q breakfast  . folic acid  1 mg Oral Daily  . multivitamin with minerals  1 tablet Oral Daily  . nicotine  21 mg Transdermal Daily  . OxyCODONE  40 mg Oral Q12H  . pantoprazole  40 mg Oral Daily  . PARoxetine  20 mg Oral BH-q7a  . pneumococcal 23 valent vaccine  0.5 mL Intramuscular Tomorrow-1000  . pregabalin  100 mg Oral TID  . sodium chloride  3 mL Intravenous Q12H  . thiamine  100 mg Oral Daily   Or  . thiamine  100 mg Intravenous Daily  . vancomycin  1,250 mg Intravenous Q12H   Assessment: 53 yo man currently on vancomycin to add cefepime for osteomyelitis.  His CrCl ~85 ml/min.  Goal of Therapy:  Eradication of infection  Plan:  Cefepime 2 gm IV q8 hours F/u cultures and clinical progress.  Rima Blizzard Poteet 09/19/2013,3:00 PM

## 2013-09-19 NOTE — Progress Notes (Signed)
Peripherally Inserted Central Catheter/Midline Placement  The IV Nurse has discussed with the patient and/or persons authorized to consent for the patient, the purpose of this procedure and the potential benefits and risks involved with this procedure.  The benefits include less needle sticks, lab draws from the catheter and patient may be discharged home with the catheter.  Risks include, but not limited to, infection, bleeding, blood clot (thrombus formation), and puncture of an artery; nerve damage and irregular heat beat.  Alternatives to this procedure were also discussed.  PICC/Midline Placement Documentation  PICC / Midline Single Lumen 09/19/13 PICC Right Basilic 41 cm 0 cm (Active)       Christeen Douglashomas, Cassi Jenne L 09/19/2013, 10:57 PM

## 2013-09-20 LAB — BASIC METABOLIC PANEL
BUN: 12 mg/dL (ref 6–23)
CALCIUM: 8.7 mg/dL (ref 8.4–10.5)
CO2: 23 meq/L (ref 19–32)
Chloride: 105 mEq/L (ref 96–112)
Creatinine, Ser: 0.92 mg/dL (ref 0.50–1.35)
GFR calc Af Amer: >90 mL/min (ref >90)
Glucose, Bld: 101 mg/dL — ABNORMAL HIGH (ref 70–99)
Potassium: 4.4 mEq/L (ref 3.7–5.3)
SODIUM: 140 meq/L (ref 137–147)

## 2013-09-20 MED ORDER — AMOXICILLIN-POT CLAVULANATE 875-125 MG PO TABS: 1.0000 | ORAL_TABLET | Freq: Two times a day (BID) | ORAL | Status: DC

## 2013-09-20 MED ORDER — AMOXICILLIN-POT CLAVULANATE 875-125 MG PO TABS
1.0000 | ORAL_TABLET | Freq: Two times a day (BID) | ORAL | Status: DC
  Filled 2013-09-20 (×2): qty 1

## 2013-09-20 NOTE — Discharge Summary (Signed)
DISCHARGE SUMMARY  Leonard KettleDavid Alan Murray  MR#: [id number]  DOB:[birth year]  Date of Admission: 09/18/2013 Date of Discharge: 09/20/2013  Attending Physician:Yanelli Zapanta T  Patient's LOV:FIEPPIPCP:JEGEDE, Keane ScrapeLUGBEMIGA, MD  Consults: Ortho - Dr. Doneen Poissonhristopher Blackman  [facility]   Follow-up Appts:     Follow-up Information   Schedule an appointment as soon as possible for a visit with Jeanann LewandowskyJEGEDE, OLUGBEMIGA, MD. (The unit clerk will schedule an appointment for you before you leave the hospital.  )    Specialty:  Internal Medicine   Contact information:   [address] [place] [address] [phone]       Follow up with Kathryne HitchBLACKMAN,CHRISTOPHER Y, MD. (The unit clerk will schedule an appointment for you before you leave the hospital.  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 male with history of CAD status post stenting, hypertension, hyperlipidemia, OSA, chronic pain, and anxiety disorder who was referred to the ER after he was found to be hypotensive at his PCP's office. Patient stated that a month prior he started developing some ulcers on his right foot second toe. Over the following 3 weeks he noticed that his third toe on the right foot had become increasingly macerated. He was started on empiric antibiotics by his podiatrist who advised him to follow with his PCP to check for diabetes and Parkinson's.  Upon being evaluated by the PCP, he was noted to by hypotensive and sent to the ER. Xray of his foot revealed possible osteomyelitis of 3rd toe right foot.   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 toe- distal phalanx  Cefepime + Vanc was administered  during hospital stay - Dr Butler Denmarkizwan requested PICC line for long term antibiotics - Ortho evaluated the pt in consult and felt that he would need eventual but not emergent amputation - the plan per Ortho is to cont abx, wound care, stabilization of other med issues, and to f/u as ouptpt for outpt amputation - MRSA screen negative and blood cultures negative to date - abx tx course discussed w/ ID MD via telephone - w/ resolution of systemic sx, and with negative cultures, oral antibiotics are felt to be appropriate - d/c PICC line (explained to pt in depth) - begin Augmentin - f/u in short course w/ Ortho and PCP - [facility] for wound care as prescribed by Ortho, and to follow course of wound - pt warned in very clear language of warning signs to suggest progression of infection, and to report to ER should these signs develop - unit secretary to arrange outpt appointments prior to d/c per new protocol   Hypotension - suspect mostly due to dehydration w/ no clinical evidence of true bacteremia - resolved at time of d/c   ARF (acute renal failure)  - most c/w pre-renal azotemia - resolved at time of d/c w/ GFR >90  CAD (coronary artery disease)  - cont ASA and BB - asymptomatic   Hyperlipidemia  - cont statin   Chronic back pain  - resume home meds    Medication List    STOP taking these medications       amLODipine 5 MG tablet  Commonly known as:  NORVASC     clindamycin 150 MG capsule  Commonly known as:  CLEOCIN     furosemide 40 MG tablet  Commonly known as:  LASIX     isosorbide mononitrate 60 MG 24 hr tablet  Commonly known as:  IMDUR     levofloxacin 750 MG tablet  Commonly known as:  LEVAQUIN     lisinopril 20 MG tablet  Commonly known as:  PRINIVIL,ZESTRIL      TAKE these medications       ALPRAZolam 1 MG tablet  Commonly known as:  XANAX  Take 1 mg by mouth 3 (three) times daily as needed. For anxiety     amitriptyline 100 MG tablet  Commonly known  as:  ELAVIL  Take 100 mg by mouth at bedtime.     amoxicillin-clavulanate 875-125 MG per tablet  Commonly known as:  AUGMENTIN  Take 1 tablet by mouth every 12 (twelve) hours.     aspirin EC 81 MG tablet  Take 81 mg by mouth daily.     atorvastatin 40 MG tablet  Commonly known as:  LIPITOR  Take 1 tablet (40 mg total) by mouth daily.     clopidogrel 75 MG tablet  Commonly known as:  PLAVIX  Take 1 tablet (75 mg total) by mouth daily.     nebivolol 5 MG tablet  Commonly known as:  BYSTOLIC  Take 1 tablet (5 mg total) by mouth daily.     nitroGLYCERIN 0.4 MG SL tablet  Commonly known as:  NITROSTAT  Place 0.4 mg under the tongue every 5 (five) minutes as needed for chest pain.     oxyCODONE 20 MG 12 hr tablet  Commonly known as:  OXYCONTIN  Take 40 mg by mouth every 12 (twelve) hours.     oxyCODONE 15 MG immediate release tablet  Commonly known as:  ROXICODONE  Take 15 mg by mouth every 4 (four) hours.     pantoprazole 40 MG tablet  Commonly known as:  PROTONIX  Take 1 tablet (40 mg total) by mouth daily.     PARoxetine 20 MG tablet  Commonly known as:  PAXIL  Take 20 mg by mouth every morning.     pregabalin 100 MG capsule  Commonly known as:  LYRICA  Take 100 mg by mouth 3 (three) times daily.       Day of Discharge BP 110/73  Pulse 53  Temp(Src) 97.8 F (36.6 C) (Oral)  Resp 16  Ht 5\' 10"  (1.778 m)  Wt 108.3 kg (238 lb 12.1 oz)  BMI 34.26 kg/m2  SpO2 96%  Physical Exam: General: No acute respiratory distress Lungs: Clear to auscultation bilaterally without wheezes or crackles Cardiovascular: Regular rate and rhythm without murmur gallop or rub normal S1 and S2 Abdomen: Nontender, nondistended, soft, bowel sounds positive, no rebound, no ascites, no appreciable mass Extremities: No significant cyanosis, clubbing, or edema bilateral lower extremities - Ulcerations right 2nd and 3rd toe pads w/ plantar aspect of third toe with significant erythema and  maceration but w/o purulent drainage - foot itself is w/o erythema or calor or induration w/ no evidence on exam of spread beyond the mid to distal portion of the affected toes   BASIC METABOLIC PANEL     Status: Abnormal   Collection Time    09/20/13  4:20 AM      Result Value Range   Sodium 140  137 - 147 mEq/L   Potassium 4.4  3.7 - 5.3 mEq/L   Chloride 105  96 - 112 mEq/L   CO2 23  19 - 32 mEq/L   Glucose, Bld 101 (*) 70 - 99 mg/dL   BUN 12  6 - 23 mg/dL   Creatinine, Ser 4.09  0.50 - 1.35 mg/dL   Calcium 8.7  8.4 - 81.1 mg/dL   GFR calc non Af Amer >90  >90 mL/min   GFR calc Af Amer >90  >90 mL/min    Time spent in discharge (includes decision making & examination of pt): >30 minutes  09/20/2013, 2:57 PM   Lonia Blood, MD [facility] Office  [phone] Pager [phone]  On-Call/Text Page:      [email or web address]      password [facility]

## 2013-09-20 NOTE — Evaluation (Signed)
Physical Therapy Evaluation Patient Details Name: Leonard KettleDavid Murray Leonard Murray    MRN: [id number] DOB: 01-31-1961 Today's Date: 09/20/2013 Time: [id number] PT Time Calculation (min): 26 min  PT Assessment / Plan / Recommendation History of Present Illness  Leonard KettleDavid Murray Murray is a 53 y.o. male with history of CAD status post stenting, hypertension, hyperlipidemia, OSA, chronic pain, anxiety disorder who was referred to the ER after patient was found to be hypotensive at his primary care's office. Patient states that a month ago he has started developing some ulcers on his right foot second toe. He had some dressing placed on his third toe by his podiatrist 4 weeks ago. Over the last 3 weeks he noticed that his third toe on the right foot has become increasingly macerated. He was started on empiric antibiotics by his podiatrist. His podiatrist has advised him to follow with his primary care to check for diabetes and Parkinson's. So when he had gone to his primary care and was found to be hypotensive he was refer to the ER. In the ER patient was found to have blood pressures in the low 70s systolic and improved with 2 L normal saline bolus. His lactic acid was high. X-rays of the right foot shows possible osteomyelitis of the third toe of the right foot. Patient has been started on vancomycin. Patient states that yesterday he had the having some dizziness and also had sweating spells. He did not pass out or did not have any chest pain shortness of breath nausea vomiting abdominal pain or diarrhea. Patient had similar episode in 2013 with hypotension where he had extensive workup including ACTH stimulation test all of which were unremarkable as per the records. 2-D echo done at that time were showing EF of 55-60%. Patient also states that over the last 6 months he has been having increasing tremors of his both upper extremities. Patient has stopped drinking alcohol over the last one week.  3rd toe on R foot found to have  osteo.  Likely to need amputation,.  Clinical Impression  Patient evaluated by Physical Therapy with no further acute PT needs identified. All education has been completed and the patient has no further questions.  See below for any follow-up Physial Therapy or equipment needs. PT is signing off. Thank you for this referral.     PT Assessment  Patient needs continued PT services    Follow Up Recommendations  No PT follow up    Does the patient have the potential to tolerate intense rehabilitation      Barriers to Discharge Decreased caregiver support      Equipment Recommendations  None recommended by PT    Recommendations for Other Services     Frequency      Precautions / Restrictions Restrictions Weight Bearing Restrictions: No   Pertinent Vitals/Pain       Mobility  Transfers Overall transfer level: Modified independent Transfers: Sit to/from Stand Sit to Stand: Modified independent (Device/Increase time) Ambulation/Gait Ambulation/Gait assistance: Modified independent (Device/Increase time) Ambulation Distance (Feet): 140 Feet Assistive device: Straight cane Gait Pattern/deviations: Step-through pattern (gait altered by [facility], but safe) Gait velocity: slower due to [facility] Gait velocity interpretation: Below normal speed for age/gender Stairs:  (pt deferred)    Exercises     PT Diagnosis:    PT Problem List: Decreased activity tolerance;Impaired sensation PT Treatment Interventions:       PT Goals(Current goals can be found in the care plan section) Acute Rehab PT Goals PT Goal Formulation: No  goals set, d/c therapy  Visit Information  Last PT Received On: 09/20/13 Assistance Needed: +1 History of Present Illness: Leonard Murray is a 53 y.o. male with history of CAD status post stenting, hypertension, hyperlipidemia, OSA, chronic pain, anxiety disorder who was referred to the ER after patient was found to be hypotensive at his primary care's office.  Patient states that a month ago he has started developing some ulcers on his right foot second toe. He had some dressing placed on his third toe by his podiatrist 4 weeks ago. Over the last 3 weeks he noticed that his third toe on the right foot has become increasingly macerated. He was started on empiric antibiotics by his podiatrist. His podiatrist has advised him to follow with his primary care to check for diabetes and Parkinson's. So when he had gone to his primary care and was found to be hypotensive he was refer to the ER. In the ER patient was found to have blood pressures in the low 70s systolic and improved with 2 L normal saline bolus. His lactic acid was high. X-rays of the right foot shows possible osteomyelitis of the third toe of the right foot. Patient has been started on vancomycin. Patient states that yesterday he had the having some dizziness and also had sweating spells. He did not pass out or did not have any chest pain shortness of breath nausea vomiting abdominal pain or diarrhea. Patient had similar episode in 2013 with hypotension where he had extensive workup including ACTH stimulation test all of which were unremarkable as per the records. 2-D echo done at that time were showing EF of 55-60%. Patient also states that over the last 6 months he has been having increasing tremors of his both upper extremities. Patient has stopped drinking alcohol over the last one week.  3rd toe on R foot found to have osteo.  Likely to need amputation,.       Prior Functioning  Home Living Family/patient expects to be discharged to:: Private residence Living Arrangements: Alone Available Help at Discharge: Available PRN/intermittently Type of Home: House Home Access: Stairs to enter Home Layout: One level Home Equipment: Gilmer Mor - single point Prior Function Level of Independence: Independent with assistive device(s) Communication Communication: No difficulties    Cognition   Cognition Arousal/Alertness: Awake/alert Behavior During Therapy: WFL for tasks assessed/performed Overall Cognitive Status: Within Functional Limits for tasks assessed    Extremity/Trunk Assessment Upper Extremity Assessment Upper Extremity Assessment: Overall WFL for tasks assessed Lower Extremity Assessment Lower Extremity Assessment: Overall WFL for tasks assessed   Balance Balance Overall balance assessment: No apparent balance deficits (not formally assessed)  End of Session PT - End of Session Activity Tolerance: Patient tolerated treatment well Patient left: in bed Nurse Communication: Mobility status  GP     Jeraldine Primeau, Eliseo Gum 09/20/2013, 2:08 PM 09/20/2013  [place], [place] [phone]  (pager)

## 2013-09-20 NOTE — Discharge Instructions (Signed)
Return to the ER immediately if you develop redness of the foot, fevers, red streaks running up the leg, lightheadedness, dizzy spells, or if you feel like you are going to pass out.    Take your antibiotic EXACTLY as prescribed - do NOT miss a single dose.     Bone and Joint Infections Joint infections are called septic or infectious arthritis. An infected joint may damage cartilage and tissue very quickly. This may destroy the joint. Bone infections (osteomyelitis) may last for years. Joints may become stiff if left untreated. Bacteria are the most common cause. Other causes include viruses and fungi, but these are more rare. Bone and joint infections usually come from injury or infection elsewhere in your body; the germs are carried to your bones or joints through the bloodstream.  CAUSES   Blood-carried germs from an infection elsewhere in your body can eventually spread to a bone or joint. The germ staphylococcus is the most common cause of both osteomyelitis and septic arthritis.  An injury can introduce germs into your bones or joints. SYMPTOMS   Weight loss.  Tiredness.  Chills and fever.  Bone or joint pain at rest and with activity.  Tenderness when touching the area or bending the joint.  Refusal to bear weight on a leg or inability to use an arm due to pain.  Decreased range of motion in a joint.  Skin redness, warmth, and tenderness.  Open skin sores and drainage. RISK FACTORS Children, the elderly, and those with weak immune systems are at increased risk of bone and joint infections. It is more common in people with HIV infections and with people on chemotherapy. People are also at increased risk if they have surgery where metal implants are used to stabilize the bone. Plates, screws, or artificial joints provide a surface that bacteria can stick on. Such a growth of bacteria is called biofilm. The biofilm protects bacteria from antibiotics and bodily defenses. This  allows germs to multiply. Other reasons for increased risks include:   Having previous surgery or injury of a bone or joint.  Being on high-dose corticosteroids and immunosuppressive medications that weaken your body's resistance to germs.  Diabetes and long-standing diseases.  Use of intravenous street drugs.  Being on hemodialysis.  Having a history of urinary tract infections.  Removal of your spleen (splenectomy). This weakens your immunity.  Chronic viral infections such as HIV or AIDS.  Lack of sensation such as paraplegia, quadriplegia, or spina bifida. DIAGNOSIS   Increased numbers of white blood cells in your blood may indicate infection. Some times your caregivers are able to identify the infecting germs by testing your blood. Inflammatory markers present in your bloodstream such as an erythrocyte sedimentation rate (ESR or sed rate) or c-reactive protein (CRP) can be indicators of deep infection.  Bone scans and X-ray exams are necessary for diagnosing osteomyelitis. They may help your caregiver find the infected areas. Other studies may give more detailed information. They may help detect fluid collections around a joint, abnormal bone surfaces, or be useful in diagnosing septic arthritis. They can find soft tissue swelling and find excess fluid in an infected joint or the adjacent bone. These tests include:  Ultrasound.  CT (computerized tomography).  MRI (magnetic resonance imaging).  The best test for diagnosing a bone or joint infection is an aspiration or biopsy. Your caregiver will usually use a local anesthetic. He or she can then remove tissue from a bone injury or use a needle to  take fluids from an infected joint. A local anesthetic medication numbs the area to be biopsied. Often biopsies are done in the operating room under general anesthesia. This means you will be asleep during the procedure. Tests performed on these samples can identify an  infection. TREATMENT   Treatment can help control long-standing infections, but infections may come back.  Infections can infect any bone or joint at any age.  Bone and joint infections are rarely fatal.  Bone infection left untreated can become a never-ending infection. It can spread to other areas of your body. It may eventually cause bone death. Reduced limb or joint function can result. In severe cases, this may require removal of a limb. Spinal osteomyelitis is very dangerous. Untreated, it may damage spinal nerves and cause death.  The most common complication of septic arthritis is osteoarthritis with pain and decreased range of motion of the joint. Some forms of treatment may include:  If the infection is caused by bacteria, it is generally treated with antibiotics. You will likely receive the drugs through a vein (intravenously) for anywhere from 2 to 6 weeks. In some cases, especially with children, oral antibiotics following an initial intravenous dose may be effective. The treatment you receive depends on the:  Type of bacteria.  Location of the infection.  Type of surgery that might be done.  Other health conditions or issues you might have.  Your caregiver may drain soft tissue abscesses or pockets of fluid around infected bones or joints. If you have septic arthritis, your caregiver may use a needle to drain pus from the joint on a daily basis. He or she may use an arthroscope to clean the joint or may need to open the joint surgically to remove damaged tissue and infection. An arthroscope is an instrument like a thin lighted telescope. It can be used to look inside the joint.  Surgery is usually needed if the infection has become long-standing. It may also be needed if there is hardware (such as metal plates, screws, or artificial joints) inside the patient. Sometimes a bone or muscle graft is needed to fill in the open space. This promotes growth of new tissues and better  blood flow to the area. PREVENTION   Clean and disinfect wounds quickly to help prevent the start of a bone or joint infection. Get treatment for any infections to prevent spread to a bone or joint.  Do not smoke. Smoking decreases healing rates of bone and predisposes to infection.  When given medications that suppress your immune system, use them according to your caregiver's instructions. Do not take more than prescribed for your condition.  Take good care of your feet and skin, especially if you have diabetes, decreased sensation or circulation problems. SEEK IMMEDIATE MEDICAL CARE IF:   You cannot bear weight on a leg or use an arm, especially following a minor injury. This can be a sign of bone or joint infection.  You think you may have signs or symptoms of a bone or joint infection. Your chance of getting rid of an infection is better if treated early. Document Released: 08/01/2005 Document Revised: 10/24/2011 Document Reviewed: 07/01/2009 [facility] Patient Information 2014 [place], [place].

## 2013-09-23 ENCOUNTER — Other Ambulatory Visit (HOSPITAL_COMMUNITY): Payer: Self-pay | Admitting: Orthopaedic Surgery

## 2013-09-24 ENCOUNTER — Encounter (HOSPITAL_COMMUNITY): Payer: Self-pay | Admitting: Pharmacy Technician

## 2013-09-25 ENCOUNTER — Telehealth: Payer: Self-pay | Admitting: Internal Medicine

## 2013-09-25 ENCOUNTER — Encounter (HOSPITAL_COMMUNITY)
Admission: RE | Admit: 2013-09-25 | Discharge: 2013-09-25 | Disposition: A | Discharge status: Home / Self Care | Payer: Medicaid Other | Source: Ambulatory Visit | Attending: Orthopaedic Surgery | Admitting: Orthopaedic Surgery

## 2013-09-25 ENCOUNTER — Encounter (HOSPITAL_COMMUNITY): Payer: Self-pay

## 2013-09-25 ENCOUNTER — Other Ambulatory Visit: Payer: Self-pay | Admitting: Internal Medicine

## 2013-09-25 HISTORY — DX: Gastro-esophageal reflux disease without esophagitis: K21.9

## 2013-09-25 HISTORY — DX: Acute kidney failure, unspecified: N17.9

## 2013-09-25 HISTORY — DX: Unspecified osteoarthritis, unspecified site: M19.90

## 2013-09-25 HISTORY — DX: Osteomyelitis, unspecified: M86.9

## 2013-09-25 LAB — CBC
HCT: 40.1 % (ref 39.0–52.0)
Hemoglobin: 14 g/dL (ref 13.0–17.0)
MCH: 30.9 pg (ref 26.0–34.0)
MCHC: 34.9 g/dL (ref 30.0–36.0)
MCV: 88.5 fL (ref 78.0–100.0)
Platelets: 199 10*3/uL (ref 150–400)
RBC: 4.53 MIL/uL (ref 4.22–5.81)
RDW: 13.6 % (ref 11.5–15.5)
WBC: 6.7 10*3/uL (ref 4.0–10.5)

## 2013-09-25 LAB — CULTURE, BLOOD (ROUTINE X 2)
CULTURE: NO GROWTH
Culture: NO GROWTH

## 2013-09-25 LAB — BASIC METABOLIC PANEL
BUN: 14 mg/dL (ref 6–23)
CHLORIDE: 102 meq/L (ref 96–112)
CO2: 29 meq/L (ref 19–32)
CREATININE: 0.84 mg/dL (ref 0.50–1.35)
Calcium: 9.1 mg/dL (ref 8.4–10.5)
GLUCOSE: 99 mg/dL (ref 70–99)
Potassium: 5 mEq/L (ref 3.7–5.3)
Sodium: 139 mEq/L (ref 137–147)

## 2013-09-25 MED ORDER — PAROXETINE HCL 20 MG PO TABS: 20.0000 mg | ORAL_TABLET | ORAL | Status: DC

## 2013-09-25 NOTE — Pre-Procedure Instructions (Signed)
SPOKE WITH SHERRY AT DR. BLACKMAN'S OFFICE AND PT IS TO STAY ON HIS PLAVIX AND ASPIRIN - DOES NOT NEED TO STOP FOR SURGERY - PT AND HIS SON DANIEL ARE  AWARE. EKG REPORT 09/18/13 AND CXR REPORT 09/19/13 ARE IN EPIC. CARDIOLOGY OFFICE VISIT 07-03-13 DR. Tresa Endo. KELLY IN EPIC.

## 2013-09-25 NOTE — Telephone Encounter (Signed)
Pt says [facility] on [address] has been calling to refill pt's PARoxetine (PAXIL) 20 MG tablet but have not received a result. Please f/u with pt.

## 2013-09-25 NOTE — Telephone Encounter (Signed)
Pt medication refilled 09/25/13

## 2013-09-25 NOTE — Patient Instructions (Addendum)
  Y[facility] SURGERY IS SCHEDULED AT [facility]  ON:  Friday  2/13  REPORT TO  SHORT STAY CENTER AT:  7:30 AM      PHONE # FOR SHORT STAY IS [phone]  DO NOT EAT OR DRINK ANYTHING AFTER MIDNIGHT THE NIGHT BEFORE YOUR SURGERY.  YOU MAY BRUSH YOUR TEETH, RINSE OUT YOUR MOUTH--BUT NO WATER, NO FOOD, NO CHEWING GUM, NO MINTS, NO CANDIES, NO CHEWING TOBACCO.  PLEASE TAKE THE FOLLOWING MEDICATIONS THE AM OF YOUR SURGERY WITH A FEW SIPS OF WATER:  ALPRAZOLAM, LIPITOR, NEBIVOLOL, OXYCONTIN, PROTONIX, PAXIL, LYRICA.  BRING NITROGYLCERIN    DO NOT BRING VALUABLES, MONEY, CREDIT CARDS.  DO NOT WEAR JEWELRY, MAKE-UP, NAIL POLISH AND NO METAL PINS OR CLIPS IN YOUR HAIR. CONTACT LENS, DENTURES / PARTIALS, GLASSES SHOULD NOT BE WORN TO SURGERY AND IN MOST CASES-HEARING AIDS WILL NEED TO BE REMOVED.  BRING YOUR GLASSES CASE, ANY EQUIPMENT NEEDED FOR YOUR CONTACT LENS. FOR PATIENTS ADMITTED TO THE HOSPITAL--CHECK OUT TIME THE DAY OF DISCHARGE IS 11:00 AM.  ALL INPATIENT ROOMS ARE PRIVATE - WITH BATHROOM, TELEPHONE, TELEVISION AND WIFI INTERNET.  IF YOU ARE BEING DISCHARGED THE SAME DAY OF YOUR SURGERY--YOU CAN NOT DRIVE YOURSELF HOME--AND SHOULD NOT GO HOME ALONE BY TAXI OR BUS.  NO DRIVING OR OPERATING MACHINERY, DO NOT MAKE LEGAL DECISIONS FOR 24 HOURS FOLLOWING ANESTHESIA / PAIN MEDICATIONS.  PLEASE MAKE ARRANGEMENTS FOR SOMEONE TO BE WITH YOU AT HOME THE FIRST 24 HOURS AFTER SURGERY. RESPONSIBLE DRIVER'S NAME / PHONE  PT'S SON Leonard Murray  [phone]                                                   FAILURE TO FOLLOW THESE INSTRUCTIONS MAY RESULT IN THE CANCELLATION OF YOUR SURGERY. PLEASE BE AWARE THAT YOU MAY NEED ADDITIONAL BLOOD DRAWN DAY OF YOUR SURGERY  PATIENT SIGNATURE_________________________________

## 2013-09-25 NOTE — Telephone Encounter (Signed)
Contacted patient and notified him that his prescription was sent to a different pharmacy.

## 2013-09-27 ENCOUNTER — Ambulatory Visit (HOSPITAL_COMMUNITY): Payer: Medicaid Other | Admitting: *Deleted

## 2013-09-27 ENCOUNTER — Ambulatory Visit (HOSPITAL_COMMUNITY)
Admission: RE | Admit: 2013-09-27 | Discharge: 2013-09-27 | Disposition: A | Discharge status: Home / Self Care | Payer: Medicaid Other | Source: Ambulatory Visit | Attending: Orthopaedic Surgery | Admitting: Orthopaedic Surgery

## 2013-09-27 ENCOUNTER — Encounter (HOSPITAL_COMMUNITY)
Admission: RE | Disposition: A | Discharge status: Home / Self Care | Payer: Self-pay | Source: Ambulatory Visit | Attending: Orthopaedic Surgery

## 2013-09-27 ENCOUNTER — Encounter (HOSPITAL_COMMUNITY): Payer: Medicaid Other | Admitting: *Deleted

## 2013-09-27 ENCOUNTER — Encounter (HOSPITAL_COMMUNITY): Payer: Self-pay | Admitting: *Deleted

## 2013-09-27 DIAGNOSIS — N289 Disorder of kidney and ureter, unspecified: Secondary | ICD-10-CM | POA: Insufficient documentation

## 2013-09-27 DIAGNOSIS — M869 Osteomyelitis, unspecified: Secondary | ICD-10-CM

## 2013-09-27 DIAGNOSIS — E785 Hyperlipidemia, unspecified: Secondary | ICD-10-CM | POA: Insufficient documentation

## 2013-09-27 DIAGNOSIS — I2510 Atherosclerotic heart disease of native coronary artery without angina pectoris: Secondary | ICD-10-CM | POA: Insufficient documentation

## 2013-09-27 DIAGNOSIS — I10 Essential (primary) hypertension: Secondary | ICD-10-CM | POA: Insufficient documentation

## 2013-09-27 DIAGNOSIS — E291 Testicular hypofunction: Secondary | ICD-10-CM | POA: Insufficient documentation

## 2013-09-27 DIAGNOSIS — Z8673 Personal history of transient ischemic attack (TIA), and cerebral infarction without residual deficits: Secondary | ICD-10-CM | POA: Insufficient documentation

## 2013-09-27 DIAGNOSIS — G4733 Obstructive sleep apnea (adult) (pediatric): Secondary | ICD-10-CM | POA: Insufficient documentation

## 2013-09-27 DIAGNOSIS — F17200 Nicotine dependence, unspecified, uncomplicated: Secondary | ICD-10-CM | POA: Insufficient documentation

## 2013-09-27 DIAGNOSIS — K219 Gastro-esophageal reflux disease without esophagitis: Secondary | ICD-10-CM | POA: Insufficient documentation

## 2013-09-27 DIAGNOSIS — I252 Old myocardial infarction: Secondary | ICD-10-CM | POA: Insufficient documentation

## 2013-09-27 DIAGNOSIS — E669 Obesity, unspecified: Secondary | ICD-10-CM | POA: Insufficient documentation

## 2013-09-27 DIAGNOSIS — G609 Hereditary and idiopathic neuropathy, unspecified: Secondary | ICD-10-CM | POA: Insufficient documentation

## 2013-09-27 HISTORY — PX: AMPUTATION: SHX166

## 2013-09-27 SURGERY — AMPUTATION DIGIT
Anesthesia: Regional | Site: Foot | Laterality: Right

## 2013-09-27 MED ORDER — MIDAZOLAM HCL 2 MG/2ML IJ SOLN
1.0000 mg | INTRAMUSCULAR | Status: DC | PRN
  Administered 2013-09-27: 2 mg via INTRAVENOUS

## 2013-09-27 MED ORDER — MIDAZOLAM HCL 2 MG/2ML IJ SOLN
INTRAMUSCULAR | Status: AC
  Filled 2013-09-27: qty 2

## 2013-09-27 MED ORDER — FENTANYL CITRATE 0.05 MG/ML IJ SOLN
INTRAMUSCULAR | Status: AC
  Filled 2013-09-27: qty 5

## 2013-09-27 MED ORDER — LIDOCAINE-EPINEPHRINE (PF) 2 %-1:200000 IJ SOLN
INTRAMUSCULAR | Status: DC | PRN
  Administered 2013-09-27: 20 mL

## 2013-09-27 MED ORDER — BUPIVACAINE HCL (PF) 0.25 % IJ SOLN
INTRAMUSCULAR | Status: DC | PRN
  Administered 2013-09-27: 7 mL

## 2013-09-27 MED ORDER — MORPHINE SULFATE 10 MG/ML IJ SOLN: 1.0000 mg | INTRAMUSCULAR | Status: DC | PRN

## 2013-09-27 MED ORDER — FENTANYL CITRATE 0.05 MG/ML IJ SOLN
INTRAMUSCULAR | Status: DC | PRN
Start: 2013-09-27 — End: 2013-09-27
  Administered 2013-09-27 (×3): 50 ug via INTRAVENOUS

## 2013-09-27 MED ORDER — PROPOFOL 10 MG/ML IV BOLUS
INTRAVENOUS | Status: AC
  Filled 2013-09-27: qty 20

## 2013-09-27 MED ORDER — 0.9 % SODIUM CHLORIDE (POUR BTL) OPTIME
TOPICAL | Status: DC | PRN
  Administered 2013-09-27: 1000 mL

## 2013-09-27 MED ORDER — LIDOCAINE-EPINEPHRINE 2 %-1:100000 IJ SOLN
INTRAMUSCULAR | Status: AC
  Filled 2013-09-27: qty 1

## 2013-09-27 MED ORDER — KETAMINE HCL 10 MG/ML IJ SOLN
INTRAMUSCULAR | Status: AC
  Filled 2013-09-27: qty 1

## 2013-09-27 MED ORDER — NEBIVOLOL HCL 5 MG PO TABS
5.0000 mg | ORAL_TABLET | Freq: Every day | ORAL | Status: DC
  Administered 2013-09-27: 5 mg via ORAL
  Filled 2013-09-27: qty 1

## 2013-09-27 MED ORDER — LIDOCAINE-EPINEPHRINE (PF) 2 %-1:200000 IJ SOLN
INTRAMUSCULAR | Status: AC
Start: 2013-09-27 — End: 2013-09-27
  Filled 2013-09-27: qty 20

## 2013-09-27 MED ORDER — CEFAZOLIN SODIUM-DEXTROSE 2-3 GM-% IV SOLR
INTRAVENOUS | Status: AC
  Filled 2013-09-27: qty 50

## 2013-09-27 MED ORDER — BUPIVACAINE HCL (PF) 0.25 % IJ SOLN
INTRAMUSCULAR | Status: AC
  Filled 2013-09-27: qty 30

## 2013-09-27 MED ORDER — PROMETHAZINE HCL 25 MG/ML IJ SOLN: 6.2500 mg | INTRAMUSCULAR | Status: DC | PRN

## 2013-09-27 MED ORDER — LACTATED RINGERS IV SOLN: INTRAVENOUS | Status: DC

## 2013-09-27 MED ORDER — ONDANSETRON HCL 4 MG/2ML IJ SOLN
INTRAMUSCULAR | Status: DC | PRN
  Administered 2013-09-27: 4 mg via INTRAVENOUS

## 2013-09-27 MED ORDER — LACTATED RINGERS IV SOLN
INTRAVENOUS | Status: DC | PRN
  Administered 2013-09-27 (×2): via INTRAVENOUS

## 2013-09-27 MED ORDER — LACTATED RINGERS IV SOLN
INTRAVENOUS | Status: DC
  Administered 2013-09-27: 1000 mL via INTRAVENOUS

## 2013-09-27 MED ORDER — ONDANSETRON HCL 4 MG/2ML IJ SOLN
INTRAMUSCULAR | Status: AC
  Filled 2013-09-27: qty 2

## 2013-09-27 MED ORDER — MIDAZOLAM HCL 5 MG/5ML IJ SOLN
INTRAMUSCULAR | Status: DC | PRN
  Administered 2013-09-27 (×2): 1 mg via INTRAVENOUS

## 2013-09-27 MED ORDER — CEFAZOLIN SODIUM-DEXTROSE 2-3 GM-% IV SOLR
2.0000 g | INTRAVENOUS | Status: AC
  Administered 2013-09-27: 2 g via INTRAVENOUS

## 2013-09-27 MED ORDER — PROPOFOL INFUSION 10 MG/ML OPTIME
INTRAVENOUS | Status: DC | PRN
  Administered 2013-09-27: 140 ug/kg/min via INTRAVENOUS

## 2013-09-27 MED ORDER — LIDOCAINE HCL (CARDIAC) 20 MG/ML IV SOLN
INTRAVENOUS | Status: AC
  Filled 2013-09-27: qty 5

## 2013-09-27 MED ORDER — KETAMINE HCL 10 MG/ML IJ SOLN
INTRAMUSCULAR | Status: DC | PRN
  Administered 2013-09-27: 10 mg via INTRAVENOUS

## 2013-09-27 MED ORDER — LIDOCAINE HCL (CARDIAC) 20 MG/ML IV SOLN
INTRAVENOUS | Status: DC | PRN
  Administered 2013-09-27: 50 mg via INTRAVENOUS

## 2013-09-27 SURGICAL SUPPLY — 35 items
BAG SPEC THK2 15X12 ZIP CLS (MISCELLANEOUS) ×1
BAG ZIPLOCK 12X15 (MISCELLANEOUS) ×2 IMPLANT
BANDAGE ESMARK 6X9 LF (GAUZE/BANDAGES/DRESSINGS) ×1 IMPLANT
BANDAGE GAUZE ELAST BULKY 4 IN (GAUZE/BANDAGES/DRESSINGS) ×1 IMPLANT
BNDG CMPR 9X6 STRL LF SNTH (GAUZE/BANDAGES/DRESSINGS) ×1
BNDG COHESIVE 4X5 TAN STRL (GAUZE/BANDAGES/DRESSINGS) ×1 IMPLANT
BNDG ESMARK 6X9 LF (GAUZE/BANDAGES/DRESSINGS) ×2
BNDG GAUZE ELAST 4 BULKY (GAUZE/BANDAGES/DRESSINGS) ×1 IMPLANT
CUFF TOURN SGL QUICK 34 (TOURNIQUET CUFF) ×2
CUFF TRNQT CYL 34X4X40X1 (TOURNIQUET CUFF) ×1 IMPLANT
DRAPE U-SHAPE 47X51 STRL (DRAPES) ×2 IMPLANT
DURAPREP 26ML APPLICATOR (WOUND CARE) ×2 IMPLANT
ELECT REM PT RETURN 9FT ADLT (ELECTROSURGICAL) ×2
ELECTRODE REM PT RTRN 9FT ADLT (ELECTROSURGICAL) ×1 IMPLANT
GAUZE XEROFORM 1X8 LF (GAUZE/BANDAGES/DRESSINGS) ×1 IMPLANT
GAUZE XEROFORM 5X9 LF (GAUZE/BANDAGES/DRESSINGS) ×2 IMPLANT
GLOVE BIOGEL PI IND STRL 8 (GLOVE) ×1 IMPLANT
GLOVE BIOGEL PI INDICATOR 8 (GLOVE) ×1
GLOVE ECLIPSE 8.0 STRL XLNG CF (GLOVE) ×2 IMPLANT
GLOVE ORTHO TXT STRL SZ7.5 (GLOVE) ×2 IMPLANT
GOWN STRL REUS W/TWL XL LVL3 (GOWN DISPOSABLE) ×2 IMPLANT
KIT BASIN OR (CUSTOM PROCEDURE TRAY) ×2 IMPLANT
NS IRRIG 1000ML POUR BTL (IV SOLUTION) ×2 IMPLANT
PACK LOWER EXTREMITY WL (CUSTOM PROCEDURE TRAY) ×2 IMPLANT
PAD ABD 8X10 STRL (GAUZE/BANDAGES/DRESSINGS) ×2 IMPLANT
PAD CAST 4YDX4 CTTN HI CHSV (CAST SUPPLIES) ×1 IMPLANT
PADDING CAST COTTON 4X4 STRL (CAST SUPPLIES) ×2
POSITIONER SURGICAL ARM (MISCELLANEOUS) ×2 IMPLANT
SPONGE GAUZE 4X4 12PLY (GAUZE/BANDAGES/DRESSINGS) ×2 IMPLANT
SPONGE LAP 18X18 X RAY DECT (DISPOSABLE) ×4 IMPLANT
STAPLER VISISTAT 35W (STAPLE) ×2 IMPLANT
STOCKINETTE 8 INCH (MISCELLANEOUS) ×2 IMPLANT
SUT ETHILON 2 0 PSLX (SUTURE) ×4 IMPLANT
TOWEL OR 17X26 10 PK STRL BLUE (TOWEL DISPOSABLE) ×6 IMPLANT
WATER STERILE IRR 1500ML POUR (IV SOLUTION) ×2 IMPLANT

## 2013-09-27 NOTE — Brief Op Note (Signed)
09/27/2013  10:48 AM  PATIENT:  Leonard Murray  53 y.o. male  PRE-OPERATIVE DIAGNOSIS:  Osteomyelitis right 2nd and 3rd toes  POST-OPERATIVE DIAGNOSIS:  Osteomyelitis right 2nd and 3rd toes  PROCEDURE:  Procedure(s): AMPUTATION RIGHT 3RD TOE  (Right)  SURGEON:  Surgeon(s) and Role:    * Kathryne Hitchhristopher Y Dquan Cortopassi, MD - Primary  PHYSICIAN ASSISTANT: Rexene EdisonGil Clark, PA-C  ANESTHESIA:   local and regional  EBL:  Total I/O In: 1000 [I.V.:1000] Out: -   BLOOD ADMINISTERED:none  DRAINS: none   LOCAL MEDICATIONS USED:  MARCAINE     SPECIMEN:  No Specimen  DISPOSITION OF SPECIMEN:  N/A  COUNTS:  YES  TOURNIQUET:  * No tourniquets in log *  DICTATION: .Other Dictation: Dictation Number [id number]  PLAN OF CARE: Discharge to home after PACU  PATIENT DISPOSITION:  PACU - hemodynamically stable.   Delay start of Pharmacological VTE agent (>24hrs) due to surgical blood loss or risk of bleeding: not applicable

## 2013-09-27 NOTE — Anesthesia Preprocedure Evaluation (Addendum)
Anesthesia Evaluation  Patient identified by MRN, date of birth, ID band Patient awake    Reviewed: Allergy & Precautions, H&P , NPO status , Patient's Chart, lab work & pertinent test results  Airway Mallampati: II TM Distance: >3 FB Neck ROM: Full    Dental  (+) Poor Dentition, Chipped, Dental Advisory Given   Pulmonary sleep apnea , Current Smoker,  breath sounds clear to auscultation        Cardiovascular hypertension, + angina + CAD, + Past MI and + Cardiac Stents Rhythm:Regular Rate:Normal     Neuro/Psych Anxiety Depression Chronic pain  Neuromuscular disease CVA, No Residual Symptoms    GI/Hepatic GERD-  ,(+)     substance abuse  alcohol use,   Endo/Other  negative endocrine ROS  Renal/GU Renal disease  negative genitourinary   Musculoskeletal negative musculoskeletal ROS (+)   Abdominal (+) + obese,   Peds  Hematology  (+) Blood dyscrasia, ,   Anesthesia Other Findings   Reproductive/Obstetrics                           Anesthesia Physical  Anesthesia Plan  ASA: III  Anesthesia Plan: Regional   Post-op Pain Management:    Induction:   Airway Management Planned: Simple Face Mask  Additional Equipment:   Intra-op Plan:   Post-operative Plan:   Informed Consent: I have reviewed the patients History and Physical, chart, labs and discussed the procedure including the risks, benefits and alternatives for the proposed anesthesia with the patient or authorized representative who has indicated his/her understanding and acceptance.   Dental advisory given  Plan Discussed with: CRNA  Anesthesia Plan Comments:         Anesthesia Quick Evaluation  

## 2013-09-27 NOTE — Transfer of Care (Signed)
Immediate Anesthesia Transfer of Care Note  Patient: Leonard KettleDavid Alan Trela  Procedure(s) Performed: Procedure(s): AMPUTATION RIGHT 3RD TOE  (Right)  Patient Location: PACU  Anesthesia Type:MAC  Level of Consciousness: oriented, sedated and patient cooperative  Airway & Oxygen Therapy: Patient Spontanous Breathing and Patient connected to face mask oxygen  Post-op Assessment: Report given to PACU RN, Post -op Vital signs reviewed and stable and Patient moving all extremities  Post vital signs: Reviewed and stable  Complications: No apparent anesthesia complications

## 2013-09-27 NOTE — Discharge Instructions (Signed)
You may put your full weight on you right foot in a post-op shoe as comfort allows. Do not wear a regular shoe until further notice. Leave your current dressing on and in place as well as clean and dry for the next 3-4 days. When you remove your dressing, place a small amount of neosporin on your incision daily followed by band-aids. You can get your incision wet in the shower in 5 days.

## 2013-09-27 NOTE — H&P (Signed)
Leonard Murray is an 53 y.o. male.   Chief Complaint:   Right foot with 3rd and 2nd toe wounds; known osteo of 3rd toe HPI:   53 yo male with multiple medical problems who has been seeing a Podiatrist for hsi right foot.  He was being treated for likely clawing of his toes and developed wound on the 2nd and 3rd toes.  X-rays show evidence of osteomyelitis at the tip of the 3rd toe.  He has wounds on both toes.  He now presents for surgery to address the infected bone and wounds.  He was seen last week in the hospital has a consult.  He was in the hospital at that time due to hypotension that was felt to be related to his chronic narcotic use.  He was then stabilized medically and discharged last Friday.  He now presents for definitive surgery to address his right foot.  He understands fully the risks involved due to his health.  Past Medical History  Diagnosis Date  . CAD (coronary artery disease) 12/2007    AMI with stenting; retinal embolus 08/2005; stenting 03/2009  . Hyperlipidemia   . OSA (obstructive sleep apnea)     not using CPAP-feels suffocating  . GAD (generalized anxiety disorder)   . Depression   . Chronic back pain     injuries 1998, 2008, 01/2009  . Peripheral neuropathy   . Hypogonadism male     can't afford testosterone  . Anxiety   . Anginal pain   . Myocardial infarction   . Stroke     lost vision at time of stroke - but regained vision - no deficits  . GERD (gastroesophageal reflux disease)   . Arthritis     knees and back  . Osteomyelitis     right second and third toes  . Hypertension     PT STATES HE HAS HAD PROBLEMS WITH LOW BLOOD PRESSURE AT TIMES AND HAS FALLEN--MOST RECENT HYPOTENSION WAS 09/20/13 AND WAS ADMITTED TO [facility] WITH OSTEOMYELITIS.  Marland Kitchen Acute renal failure 09/18/2013    RESOLVED BY DISCHARGE FROM [facility] ON 09/20/2013  . Hyperlipidemia     Past Surgical History  Procedure Laterality Date  . Bony pelvis surgery    . Bladder repair    . Fracture  surgery    . Cardiac catheterization    . Coronary angioplasty    . Neck surgery      fusion--some limitations in neck movement    Family History  Problem Relation Age of Onset  . Diabetes Mellitus II Mother   . Diabetes Mellitus II Father    Social History:  reports that he has been smoking Cigarettes.  He has a 60 pack-year smoking history. He has never used smokeless tobacco. He reports that he drinks alcohol. He reports that he does not use illicit drugs.  Allergies: No Known Allergies  No prescriptions prior to admission    Results for orders placed during the hospital encounter of 09/25/13 (from the past 48 hour(s))  CBC     Status: None   Collection Time    09/25/13  2:00 PM      Result Value Ref Range   WBC 6.7  4.0 - 10.5 K/uL   RBC 4.53  4.22 - 5.81 MIL/uL   Hemoglobin 14.0  13.0 - 17.0 g/dL   HCT 40.1  39.0 - 52.0 %   MCV 88.5  78.0 - 100.0 fL   MCH 30.9  26.0 - 34.0  pg   MCHC 34.9  30.0 - 36.0 g/dL   RDW 13.6  11.5 - 15.5 %   Platelets 199  150 - 400 K/uL  BASIC METABOLIC PANEL     Status: None   Collection Time    09/25/13  2:00 PM      Result Value Ref Range   Sodium 139  137 - 147 mEq/L   Potassium 5.0  3.7 - 5.3 mEq/L   Chloride 102  96 - 112 mEq/L   CO2 29  19 - 32 mEq/L   Glucose, Bld 99  70 - 99 mg/dL   BUN 14  6 - 23 mg/dL   Creatinine, Ser 0.84  0.50 - 1.35 mg/dL   Calcium 9.1  8.4 - 10.5 mg/dL   GFR calc non Af Amer >90  >90 mL/min   GFR calc Af Amer >90  >90 mL/min   Comment: (NOTE)     The eGFR has been calculated using the CKD EPI equation.     This calculation has not been validated in all clinical situations.     eGFR's persistently <90 mL/min signify possible Chronic Kidney     Disease.   No results found.  Review of Systems  All other systems reviewed and are negative.    There were no vitals taken for this visit. Physical Exam  Constitutional: He is oriented to person, place, and time. He appears well-developed and  well-nourished.  HENT:  Head: Normocephalic and atraumatic.  Eyes: Pupils are equal, round, and reactive to light.  Neck: Normal range of motion. Neck supple.  Cardiovascular: Normal rate.   Respiratory: Effort normal.  GI: Soft. Bowel sounds are normal.  Musculoskeletal:       Feet:  Neurological: He is alert and oriented to person, place, and time.  Skin: Skin is warm and dry.     Assessment/Plan Right foot with known osteomyelitis of the 3rd toe and wounds on the 2nd toe 1)  To the OR today for a right foot 3rd toe amputation and possible 2nd toe amputation.  Leonard Murray Y 09/27/2013, 7:20 AM

## 2013-09-27 NOTE — Anesthesia Postprocedure Evaluation (Signed)
Anesthesia Post Note  Patient: Leonard Murray  Procedure(s) Performed: Procedure(s) (LRB): AMPUTATION RIGHT 3RD TOE  (Right)  Anesthesia type: MAC  Patient location: PACU  Post pain: Pain level controlled  Post assessment: Post-op Vital signs reviewed  Last Vitals:  Filed Vitals:   09/27/13 1211  BP: 104/59  Pulse: 58  Temp:   Resp: 16    Post vital signs: Reviewed  Level of consciousness: sedated  Complications: No apparent anesthesia complications

## 2013-09-27 NOTE — Anesthesia Procedure Notes (Signed)
Anesthesia Regional Block:  Ankle block  Pre-Anesthetic Checklist: ,, timeout performed, Correct Patient, Correct Site, Correct Laterality, Correct Procedure, Correct Position, site marked, Risks and benefits discussed,  Surgical consent,  Pre-op evaluation,  At surgeon's request and post-op pain management  Laterality: Right  Prep: Dura Prep       Needles:       Needle Gauge: 25 and 25 G    Additional Needles: Ankle block Narrative:  Start time: 09/27/2013 9:20 AM End time: 09/27/2013 9:25 AM Injection made incrementally with aspirations every 3 mL.  Performed by: Personally  Anesthesiologist: Lucille PassyAlexander Khalise Billard MD  Additional Notes: Routine prep of right ankle followed by circumferential ankle block with 2% lidocaine with Epi 1:200,000. Total volume of 20cc. No complications noted.

## 2013-09-27 NOTE — Progress Notes (Signed)
[place], ortho tech into  [employer] patient w post op Occupational psychologistshoe

## 2013-09-28 NOTE — Op Note (Signed)
NAME:  Leonard Murray, Daylan                 ACCOUNT NO.:  [id number]  MEDICAL RECORD NO.:  [id number]  LOCATION:  [facility]                         [facility]  PHYSICIAN:  Vanita PandaChristopher Y. Magnus IvanBlackman, M.D.DATE OF BIRTH:  1960/09/27  DATE OF PROCEDURE:  09/27/2013 DATE OF DISCHARGE:  09/27/2013                              OPERATIVE REPORT   PREOPERATIVE DIAGNOSIS:  Right foot osteomyelitis of the distal phalanx of the third toe.  POSTOPERATIVE DIAGNOSIS:  Right foot third toe distal phalanx osteomyelitis.  PROCEDURE:  Right foot third toe amputation through MTP joint.  SURGEON:  Vanita PandaChristopher Y. Magnus IvanBlackman, M.D.  ASSISTANT:  Richardean CanalGilbert Clark, PA-C  ANESTHESIA: 1. Right ankle block. 2. Local digital block with 0.25% plain Marcaine.  BLOOD LOSS:  Minimal.  COMPLICATIONS:  None.  INDICATIONS:  [name] is a 53 year old gentleman with multiple medical problems, who had had clawing of his second and third toes.  He was seen by a podiatrist and eventually had some type of toe loops placed or rings that would help to straighten his toes and to keep pressure off of the wounds that he was developing over the distal phalanx due to claw toes.  I think there may have been some miscommunication and he did not remove the rings.  He was hospitalized for other reasons recently with hypotension related to his toes.  We were consulted as an outpatient last week and saw that he had macerated third toe on the right foot with some superficial wounds on the left toe.  X-rays of the foot showed moth- eaten appearance of the distal phalanx of the third toe consistent with osteomyelitis.  He was subsequently discharged from the hospital after improving his medical status and we saw him in the clinic, and due to the osteomyelitis of his third toe, recommended a third toe amputation with then hopefully cleaning the wounds and further assessment of the second toe with the possibly of second toe amputation depending  what the wounds are look like.  He agreed to this.  PROCEDURE DESCRIPTION:  After informed consent was obtained, appropriate right foot was marked, anesthesia obtained, ankle block.  He was then brought to the operating room and placed supine on the operating table. His right operative foot was then prepped and draped with DuraPrep and sterile drapes.  A time-out was called to identify the correct patient and correct right foot.  We then performed a local digital block around the third toe and then used an Esmarch around the ankle as a local tourniquet.  We then used a #10 blade and was able to dissect down to the MTP joint and removed the third toe in its entirety.  There was gross purulence at the end of the toe consistent with osteomyelitis.  We then removed soft tissue back to the MTP joint and let the Esmarch down with good bleeding tissue.  We reapproximated the skin with interrupted 3-0 nylon suture.  I then took a look at the second toe and the wounds appeared to be superficial even at the tip and the toe was still viable. There was no area of exposed bone and with x-rays showing no evidence of bony destruction,  we elected to leave the second toe alone.  We then placed Xeroform and well-padded sterile dressing.  He was taken to the recovery room in stable condition.  All final counts were correct. There were no complications noted.  Postoperatively, he will wear a postop shoe with weightbearing as tolerated on his right foot and follow up in the office in 2 weeks.     Vanita Panda. Magnus Ivan, M.D.     CYB/MEDQ  D:  09/27/2013  T:  09/28/2013  Job:  [id number]

## 2013-09-30 ENCOUNTER — Encounter (HOSPITAL_COMMUNITY): Payer: Self-pay | Admitting: Orthopaedic Surgery

## 2013-10-02 ENCOUNTER — Ambulatory Visit: Payer: Medicaid Other | Admitting: Cardiovascular Disease

## 2013-10-14 ENCOUNTER — Ambulatory Visit: Payer: Medicaid Other | Admitting: Physician Assistant

## 2013-10-16 ENCOUNTER — Ambulatory Visit (INDEPENDENT_AMBULATORY_CARE_PROVIDER_SITE_OTHER): Payer: Medicare Other | Admitting: Physician Assistant

## 2013-10-16 ENCOUNTER — Ambulatory Visit: Payer: Medicaid Other | Admitting: Internal Medicine

## 2013-10-16 ENCOUNTER — Encounter: Payer: Self-pay | Admitting: Physician Assistant

## 2013-10-16 VITALS — BP 116/78 | HR 90 | Ht 70.0 in | Wt 238.5 lb

## 2013-10-16 DIAGNOSIS — I2510 Atherosclerotic heart disease of native coronary artery without angina pectoris: Secondary | ICD-10-CM

## 2013-10-16 DIAGNOSIS — I10 Essential (primary) hypertension: Secondary | ICD-10-CM

## 2013-10-16 DIAGNOSIS — E785 Hyperlipidemia, unspecified: Secondary | ICD-10-CM

## 2013-10-16 NOTE — Progress Notes (Signed)
Date:  10/16/2013   ID:  Leonard Kettleavid Alan Uno, DOB 1961-07-26, MRN [id number]  PCP:  Jeanann LewandowskyJEGEDE, OLUGBEMIGA, MD  Primary Cardiologist:  Tresa EndoKelly     History of Present Illness: Leonard Murray is a 53 y.o. male with established coronary artery disease in May 2009 underwent stenting of his mid and distal right coronary artery. His last catheterization was in August 2010 which revealed patent stents. He had mild 20% stenosis in the LAD and mild 10-20% proximal and distal RCA stenoses. He also had an area PLA spasm which did improve with IC nitroglycerin administration. The patient has a history of ongoing tobacco use but is really trying hard to quit. He started smoking at age 53. Presently he is still smoking one half pack per day. He does have a history of significant peripheral neuropathy. He also has a history of hypertension, anxiety, mixed hyperlipidemia, and chronic pain syndrome. Apparently he had been hospitalized also for possible dehydration several months ago. Apparently he stopped taking his Bystolic at that time.  On February 13 patient underwent amputation of his third right toe and is being treated for osteomyelitis.   Normal echo in 2013.  Presents today for evaluation. Cardiology standpoint patient feels good. He denies any angina, shortness of breath, orthopnea, PND, dizziness. He does report some occasional abdominal pain right upper quadrant and has been [facility] which showed no acute findings. He also has some mild lower extremity edema is keep his feet elevated.  The patient currently denies nausea, vomiting, fever,  cough, congestion, hematochezia, melena, claudication.    Wt Readings from Last 3 Encounters:  10/16/13 238 lb 8 oz (108.183 kg)  09/25/13 230 lb (104.327 kg)  09/19/13 238 lb 12.1 oz (108.3 kg)     Past Medical History  Diagnosis Date  . CAD (coronary artery disease) 12/2007    AMI with stenting; retinal embolus 08/2005;  stenting 03/2009  . Hyperlipidemia   . OSA (obstructive sleep apnea)     not using CPAP-feels suffocating  . GAD (generalized anxiety disorder)   . Depression   . Chronic back pain     injuries 1998, 2008, 01/2009  . Peripheral neuropathy   . Hypogonadism male     can't afford testosterone  . Anxiety   . Anginal pain   . Myocardial infarction   . Stroke     lost vision at time of stroke - but regained vision - no deficits  . GERD (gastroesophageal reflux disease)   . Arthritis     knees and back  . Osteomyelitis     right second and third toes  . Hypertension     PT STATES HE HAS HAD PROBLEMS WITH LOW BLOOD PRESSURE AT TIMES AND HAS FALLEN--MOST RECENT HYPOTENSION WAS 09/20/13 AND WAS ADMITTED TO [facility] WITH OSTEOMYELITIS.  Marland Kitchen. Acute renal failure 09/18/2013    [facility]OL[facility] 09[facility]20/2013  . Hyperlipidemia     Current Outpatient Prescriptions  Medication Sig Dispense Refill  . ALPRAZolam (XANAX) 1 MG tablet Take 1 mg by mouth 3 (three) times daily as needed. For anxiety      . amitriptyline (ELAVIL) 100 MG tablet Take 100 mg by mouth at bedtime.      Marland Kitchen. amoxicillin-clavulanate (AUGMENTIN) 875-125 MG per tablet Take 1 tablet by mouth every 12 (twelve) hours.  60 tablet  0  . aspirin EC 81 MG tablet Take 81 mg by mouth daily.      .Marland Kitchen  atorvastatin (LIPITOR) 40 MG tablet Take 40 mg by mouth every morning.      . clopidogrel (PLAVIX) 75 MG tablet Take 75 mg by mouth daily with breakfast.      . nebivolol (BYSTOLIC) 5 MG tablet Take 5 mg by mouth every morning.      . nitroGLYCERIN (NITROSTAT) 0.4 MG SL tablet Place 0.4 mg under the tongue every 5 (five) minutes as needed for chest pain.      . OxyCODONE (OXYCONTIN) 40 mg T12A 12 hr tablet Take 40 mg by mouth every 12 (twelve) hours.      Marland Kitchen oxyCODONE (ROXICODONE) 15 MG immediate release tablet Take 15 mg by mouth every 4 (four) hours.      . pantoprazole (PROTONIX) 40 MG tablet Take 1 tablet (40 mg total) by mouth daily.   30 tablet  3  . PARoxetine (PAXIL) 20 MG tablet Take 1 tablet (20 mg total) by mouth every morning.  90 tablet  3  . pregabalin (LYRICA) 100 MG capsule Take 100 mg by mouth 3 (three) times daily.       No current facility-administered medications for this visit.    Allergies:   No Known Allergies  Social History:  The patient  reports that he has been smoking Cigarettes.  He has a 60 pack-year smoking history. He has never used smokeless tobacco. He reports that he drinks alcohol. He reports that he does not use illicit drugs.   Family history:   Family History  Problem Relation Age of Onset  . Diabetes Mellitus II Mother   . Diabetes Mellitus II Father     ROS:  Please see the history of present illness.  All other systems reviewed and negative.   PHYSICAL EXAM: VS:  BP 116/78  Pulse 90  Ht 5\' 10"  (1.778 m)  Wt 238 lb 8 oz (108.183 kg)  BMI 34.22 kg/m2 Obese, well developed, in no acute distress HEENT: Pupils are equal round react to light accommodation extraocular movements are intact.  Neck: no JVDNo cervical lymphadenopathy. Cardiac: Regular rate and rhythm without murmurs rubs or gallops. Lungs:  clear to auscultation bilaterally, no wheezing, rhonchi or rales Abd: soft, nontender, positive bowel sounds all quadrants, no hepatomegaly Ext: 1+ lower extremity edema.  2+ radial and dorsalis pedis pulses. Skin: warm and dry Neuro:  Grossly normal  EKG:   Normal sinus rhythm rate 90 beats per minute  ASSESSMENT AND PLAN:  Problem List Items Addressed This Visit   CAD (coronary artery disease) - Primary     Patient appears stable from a cardiology standpoint. No acute changes on his EKG. No angina continues on aspirin and Plavix. Blood pressure is well-controlled. He has been recovering from amputation of his third right toe and osteomyelitis. Continues on antibiotic.  He has good pulses in both feet.    Hyperlipidemia     Treated with Lipitor    Unspecified essential  hypertension

## 2013-10-16 NOTE — Assessment & Plan Note (Signed)
Treated with Lipitor 

## 2013-10-16 NOTE — Assessment & Plan Note (Signed)
Patient appears stable from a cardiology standpoint. No acute changes on his EKG. No angina continues on aspirin and Plavix diastolic. Blood pressure is well-controlled. He has been recovering from indication of his third right toe and osteomyelitis. Continues on antibiotic.  He has good pulses in both feet.

## 2013-10-16 NOTE — Patient Instructions (Addendum)
1.  Follow up with Dr. Tresa EndoKelly in 6 months.

## 2013-10-23 ENCOUNTER — Telehealth: Payer: Self-pay | Admitting: Internal Medicine

## 2013-10-23 NOTE — Telephone Encounter (Signed)
[facility] calling because they have not received response for the following faxed form: ° °The Home health certification and plan of care form (signed and filled out by physician) ° °Fax : [phone]  °

## 2013-11-11 ENCOUNTER — Ambulatory Visit: Payer: Medicare Other | Admitting: Internal Medicine

## 2013-11-14 ENCOUNTER — Telehealth: Payer: Self-pay

## 2013-11-14 ENCOUNTER — Other Ambulatory Visit: Payer: Self-pay

## 2013-11-14 DIAGNOSIS — S99929A Unspecified injury of unspecified foot, initial encounter: Secondary | ICD-10-CM

## 2013-11-14 NOTE — Telephone Encounter (Signed)
Patient is requesting refill on his elavil Can this be refilled?

## 2013-12-07 ENCOUNTER — Other Ambulatory Visit: Payer: Self-pay | Admitting: Internal Medicine

## 2013-12-07 DIAGNOSIS — K219 Gastro-esophageal reflux disease without esophagitis: Secondary | ICD-10-CM

## 2013-12-20 ENCOUNTER — Other Ambulatory Visit (HOSPITAL_COMMUNITY): Payer: Self-pay | Admitting: Orthopaedic Surgery

## 2013-12-23 ENCOUNTER — Encounter (HOSPITAL_COMMUNITY): Payer: Self-pay | Admitting: Pharmacy Technician

## 2013-12-24 ENCOUNTER — Encounter (HOSPITAL_COMMUNITY)
Admission: RE | Admit: 2013-12-24 | Discharge: 2013-12-24 | Disposition: A | Discharge status: Home / Self Care | Payer: Medicare Other | Source: Ambulatory Visit | Attending: Orthopaedic Surgery | Admitting: Orthopaedic Surgery

## 2013-12-24 ENCOUNTER — Encounter (INDEPENDENT_AMBULATORY_CARE_PROVIDER_SITE_OTHER): Payer: Self-pay

## 2013-12-24 ENCOUNTER — Encounter (HOSPITAL_COMMUNITY): Payer: Self-pay

## 2013-12-24 DIAGNOSIS — L0390 Cellulitis, unspecified: Secondary | ICD-10-CM

## 2013-12-24 HISTORY — DX: Cellulitis, unspecified: L03.90

## 2013-12-24 HISTORY — DX: Unspecified visual loss: H54.7

## 2013-12-24 LAB — CBC
HCT: 44.8 % (ref 39.0–52.0)
Hemoglobin: 15.5 g/dL (ref 13.0–17.0)
MCH: 31.1 pg (ref 26.0–34.0)
MCHC: 34.6 g/dL (ref 30.0–36.0)
MCV: 90 fL (ref 78.0–100.0)
PLATELETS: 199 10*3/uL (ref 150–400)
RBC: 4.98 MIL/uL (ref 4.22–5.81)
RDW: 14 % (ref 11.5–15.5)
WBC: 7.3 10*3/uL (ref 4.0–10.5)

## 2013-12-24 LAB — BASIC METABOLIC PANEL
BUN: 10 mg/dL (ref 6–23)
CALCIUM: 9.5 mg/dL (ref 8.4–10.5)
CHLORIDE: 99 meq/L (ref 96–112)
CO2: 29 mEq/L (ref 19–32)
Creatinine, Ser: 0.79 mg/dL (ref 0.50–1.35)
GLUCOSE: 133 mg/dL — AB (ref 70–99)
POTASSIUM: 4.4 meq/L (ref 3.7–5.3)
Sodium: 137 mEq/L (ref 137–147)

## 2013-12-24 NOTE — Pre-Procedure Instructions (Signed)
10-24-13 EKG 3'15, Echo 11'13, Stress 1'12-Epic. CXR 2'15 Epic. LOV notes with cardiology -10-16-13 Epic.

## 2013-12-24 NOTE — Progress Notes (Signed)
12-24-13 1530 Pt. Remains on Plavix and Aspirin 81mg - will not take AM of surgery. Please advise patient if needing to stop sooner.

## 2013-12-24 NOTE — Patient Instructions (Signed)
20 Leonard KettleDavid Alan Murray  12/24/2013   Your procedure is scheduled on:   12-26-2013  Enter through [facility] and follow signs to Short Stay Center. Arrive at    0530    AM.  Call this number if you have problems the morning of surgery: [phone]  Or Presurgical Testing [phone](Chemika Nighteng[place]) For Living Will and/or Health Care Power Attorney Forms: please provide copy for your medical record,may bring AM of surgery(Forms should be already notarized -we do not provide this service).(12-24-13  No further information preferred today).   For Cpap use: Bring mask and tubing only.   Do not eat food:After Midnight.    Take these medicines the morning of surgery with A SIP OF WATER: Atorvastatin. Bystolic. Protonix. Xanax. Paxil. Lyrica. Pain meds.   Do not wear jewelry, make-up or nail polish.  Do not wear lotions, powders, or perfumes. You may wear deodorant.  Do not shave 48 hours(2 days) prior to first CHG shower(legs and under arms).(Shaving face and neck okay.)  Do not bring valuables to the hospital.(Hospital is not responsible for lost valuables).  Contacts, dentures or removable bridgework, body piercing, hair pins may not be worn into surgery.  Leave suitcase in the car. After surgery it may be brought to your room.  For patients admitted to the hospital, checkout time is 11:00 AM the day of discharge.(Restricted visitors-Any Persons displaying flu-like symptoms or illness).    Patients discharged the day of surgery will not be allowed to drive home. Must have responsible person with you x 24 hours once discharged.  Name and phone number of your driver: Robin [phone]-[phone] cell  Special Instructions: CHG(Chlorhedine 4%-"Hibiclens","Betasept","Aplicare") Shower Use Special Wash: see special instructions.(avoid face and genitals)     Failure to follow these instructions may result in Cancellation of your surgery.   Patient  signature_______________________________________________________

## 2013-12-26 ENCOUNTER — Ambulatory Visit (HOSPITAL_COMMUNITY): Payer: Medicare Other | Admitting: Certified Registered"

## 2013-12-26 ENCOUNTER — Encounter (HOSPITAL_COMMUNITY): Payer: Medicare Other | Admitting: Certified Registered"

## 2013-12-26 ENCOUNTER — Ambulatory Visit (HOSPITAL_COMMUNITY)
Admission: RE | Admit: 2013-12-26 | Discharge: 2013-12-26 | Disposition: A | Discharge status: Home / Self Care | Payer: Medicare Other | Source: Ambulatory Visit | Attending: Orthopaedic Surgery | Admitting: Orthopaedic Surgery

## 2013-12-26 ENCOUNTER — Encounter (HOSPITAL_COMMUNITY): Payer: Self-pay | Admitting: Certified Registered"

## 2013-12-26 ENCOUNTER — Encounter (HOSPITAL_COMMUNITY)
Admission: RE | Disposition: A | Discharge status: Home / Self Care | Payer: Self-pay | Source: Ambulatory Visit | Attending: Orthopaedic Surgery

## 2013-12-26 DIAGNOSIS — F329 Major depressive disorder, single episode, unspecified: Secondary | ICD-10-CM | POA: Insufficient documentation

## 2013-12-26 DIAGNOSIS — E291 Testicular hypofunction: Secondary | ICD-10-CM | POA: Insufficient documentation

## 2013-12-26 DIAGNOSIS — E785 Hyperlipidemia, unspecified: Secondary | ICD-10-CM | POA: Insufficient documentation

## 2013-12-26 DIAGNOSIS — Z79899 Other long term (current) drug therapy: Secondary | ICD-10-CM | POA: Insufficient documentation

## 2013-12-26 DIAGNOSIS — G4733 Obstructive sleep apnea (adult) (pediatric): Secondary | ICD-10-CM | POA: Insufficient documentation

## 2013-12-26 DIAGNOSIS — K219 Gastro-esophageal reflux disease without esophagitis: Secondary | ICD-10-CM | POA: Insufficient documentation

## 2013-12-26 DIAGNOSIS — G609 Hereditary and idiopathic neuropathy, unspecified: Secondary | ICD-10-CM | POA: Insufficient documentation

## 2013-12-26 DIAGNOSIS — D759 Disease of blood and blood-forming organs, unspecified: Secondary | ICD-10-CM | POA: Insufficient documentation

## 2013-12-26 DIAGNOSIS — I252 Old myocardial infarction: Secondary | ICD-10-CM | POA: Insufficient documentation

## 2013-12-26 DIAGNOSIS — F17200 Nicotine dependence, unspecified, uncomplicated: Secondary | ICD-10-CM | POA: Insufficient documentation

## 2013-12-26 DIAGNOSIS — F3289 Other specified depressive episodes: Secondary | ICD-10-CM | POA: Insufficient documentation

## 2013-12-26 DIAGNOSIS — Z9861 Coronary angioplasty status: Secondary | ICD-10-CM | POA: Insufficient documentation

## 2013-12-26 DIAGNOSIS — I2510 Atherosclerotic heart disease of native coronary artery without angina pectoris: Secondary | ICD-10-CM | POA: Insufficient documentation

## 2013-12-26 DIAGNOSIS — E669 Obesity, unspecified: Secondary | ICD-10-CM | POA: Insufficient documentation

## 2013-12-26 DIAGNOSIS — I10 Essential (primary) hypertension: Secondary | ICD-10-CM | POA: Insufficient documentation

## 2013-12-26 DIAGNOSIS — Z8673 Personal history of transient ischemic attack (TIA), and cerebral infarction without residual deficits: Secondary | ICD-10-CM | POA: Insufficient documentation

## 2013-12-26 DIAGNOSIS — M869 Osteomyelitis, unspecified: Secondary | ICD-10-CM

## 2013-12-26 DIAGNOSIS — F411 Generalized anxiety disorder: Secondary | ICD-10-CM | POA: Insufficient documentation

## 2013-12-26 DIAGNOSIS — Z7902 Long term (current) use of antithrombotics/antiplatelets: Secondary | ICD-10-CM | POA: Insufficient documentation

## 2013-12-26 HISTORY — PX: AMPUTATION: SHX166

## 2013-12-26 SURGERY — AMPUTATION DIGIT
Anesthesia: Regional | Site: Toe | Laterality: Right

## 2013-12-26 MED ORDER — MIDAZOLAM HCL 2 MG/2ML IJ SOLN
INTRAMUSCULAR | Status: AC
  Filled 2013-12-26: qty 2

## 2013-12-26 MED ORDER — NEBIVOLOL HCL 5 MG PO TABS
5.0000 mg | ORAL_TABLET | Freq: Once | ORAL | Status: AC
  Administered 2013-12-26: 5 mg via ORAL
  Filled 2013-12-26: qty 1

## 2013-12-26 MED ORDER — FENTANYL CITRATE 0.05 MG/ML IJ SOLN
INTRAMUSCULAR | Status: AC
  Filled 2013-12-26: qty 2

## 2013-12-26 MED ORDER — PROPOFOL INFUSION 10 MG/ML OPTIME
INTRAVENOUS | Status: DC | PRN
  Administered 2013-12-26: 120 ug/kg/min via INTRAVENOUS

## 2013-12-26 MED ORDER — FENTANYL CITRATE 0.05 MG/ML IJ SOLN: 25.0000 ug | INTRAMUSCULAR | Status: DC | PRN

## 2013-12-26 MED ORDER — CEFAZOLIN SODIUM-DEXTROSE 2-3 GM-% IV SOLR
2.0000 g | INTRAVENOUS | Status: AC
  Administered 2013-12-26: 2 g via INTRAVENOUS

## 2013-12-26 MED ORDER — CEFAZOLIN SODIUM-DEXTROSE 2-3 GM-% IV SOLR
INTRAVENOUS | Status: AC
  Filled 2013-12-26: qty 50

## 2013-12-26 MED ORDER — LACTATED RINGERS IV SOLN: INTRAVENOUS | Status: DC

## 2013-12-26 MED ORDER — LIDOCAINE HCL 2 % IJ SOLN
INTRAMUSCULAR | Status: AC
  Filled 2013-12-26: qty 20

## 2013-12-26 MED ORDER — MEPERIDINE HCL 50 MG/ML IJ SOLN
6.2500 mg | INTRAMUSCULAR | Status: DC | PRN
Start: 2013-12-26 — End: 2013-12-26

## 2013-12-26 MED ORDER — LIDOCAINE-EPINEPHRINE 2 %-1:100000 IJ SOLN
INTRAMUSCULAR | Status: DC | PRN
  Administered 2013-12-26: 20 mL via PERINEURAL

## 2013-12-26 MED ORDER — 0.9 % SODIUM CHLORIDE (POUR BTL) OPTIME
TOPICAL | Status: DC | PRN
  Administered 2013-12-26: 1000 mL

## 2013-12-26 MED ORDER — PROMETHAZINE HCL 25 MG/ML IJ SOLN: 6.2500 mg | INTRAMUSCULAR | Status: DC | PRN

## 2013-12-26 MED ORDER — ONDANSETRON HCL 4 MG/2ML IJ SOLN
INTRAMUSCULAR | Status: DC | PRN
  Administered 2013-12-26: 4 mg via INTRAVENOUS

## 2013-12-26 MED ORDER — LIDOCAINE HCL (CARDIAC) 20 MG/ML IV SOLN
INTRAVENOUS | Status: DC | PRN
  Administered 2013-12-26: 50 mg via INTRAVENOUS

## 2013-12-26 MED ORDER — OXYCODONE HCL 5 MG PO TABS: 5.0000 mg | ORAL_TABLET | Freq: Once | ORAL | Status: DC

## 2013-12-26 MED ORDER — ONDANSETRON HCL 4 MG/2ML IJ SOLN
INTRAMUSCULAR | Status: AC
  Filled 2013-12-26: qty 2

## 2013-12-26 MED ORDER — OXYCODONE HCL 5 MG PO TABS: 10.0000 mg | ORAL_TABLET | Freq: Once | ORAL | Status: DC

## 2013-12-26 MED ORDER — MIDAZOLAM HCL 5 MG/5ML IJ SOLN
INTRAMUSCULAR | Status: DC | PRN
  Administered 2013-12-26 (×2): 1 mg via INTRAVENOUS

## 2013-12-26 MED ORDER — LACTATED RINGERS IV SOLN
INTRAVENOUS | Status: DC | PRN
  Administered 2013-12-26: 07:00:00 via INTRAVENOUS

## 2013-12-26 MED ORDER — PROPOFOL 10 MG/ML IV BOLUS
INTRAVENOUS | Status: AC
  Filled 2013-12-26: qty 20

## 2013-12-26 MED ORDER — PROPOFOL 10 MG/ML IV BOLUS
INTRAVENOUS | Status: DC | PRN
  Administered 2013-12-26: 20 mg via INTRAVENOUS

## 2013-12-26 MED ORDER — EPINEPHRINE HCL 1 MG/ML IJ SOLN
INTRAMUSCULAR | Status: AC
  Filled 2013-12-26: qty 1

## 2013-12-26 MED ORDER — OXYCODONE HCL 5 MG PO TABS: 10.0000 mg | ORAL_TABLET | ORAL | Status: DC | PRN

## 2013-12-26 MED ORDER — FENTANYL CITRATE 0.05 MG/ML IJ SOLN
INTRAMUSCULAR | Status: DC | PRN
  Administered 2013-12-26 (×2): 50 ug via INTRAVENOUS

## 2013-12-26 SURGICAL SUPPLY — 15 items
BANDAGE ESMARK 6X9 LF (GAUZE/BANDAGES/DRESSINGS) IMPLANT
BNDG CMPR 9X6 STRL LF SNTH (GAUZE/BANDAGES/DRESSINGS) ×1
BNDG COHESIVE 4X5 TAN STRL (GAUZE/BANDAGES/DRESSINGS) ×1 IMPLANT
BNDG ESMARK 6X9 LF (GAUZE/BANDAGES/DRESSINGS) ×2
BNDG GAUZE ELAST 4 BULKY (GAUZE/BANDAGES/DRESSINGS) ×1 IMPLANT
GAUZE XEROFORM 5X9 LF (GAUZE/BANDAGES/DRESSINGS) ×1 IMPLANT
GLOVE BIO SURGEON STRL SZ7.5 (GLOVE) ×2 IMPLANT
GLOVE BIOGEL PI IND STRL 8 (GLOVE) ×1 IMPLANT
GLOVE BIOGEL PI INDICATOR 8 (GLOVE) ×1
GLOVE ECLIPSE 8.0 STRL XLNG CF (GLOVE) ×2 IMPLANT
GOWN STRL REUS W/TWL XL LVL3 (GOWN DISPOSABLE) ×2 IMPLANT
PACK LOWER EXTREMITY WL (CUSTOM PROCEDURE TRAY) ×1 IMPLANT
POSITIONER SURGICAL ARM (MISCELLANEOUS) ×2 IMPLANT
SPONGE GAUZE 4X4 12PLY (GAUZE/BANDAGES/DRESSINGS) ×1 IMPLANT
TOWEL OR 17X26 10 PK STRL BLUE (TOWEL DISPOSABLE) ×4 IMPLANT

## 2013-12-26 NOTE — Progress Notes (Signed)
Pt sleeping/snoring

## 2013-12-26 NOTE — Transfer of Care (Signed)
Immediate Anesthesia Transfer of Care Note  Patient: Lona KettleDavid Alan Landsberg  Procedure(s) Performed: Procedure(s): RIGHT 2nd TOE AMPUTATION (Right)  Patient Location: PACU  Anesthesia Type:Regional  Level of Consciousness: awake, alert  and oriented  Airway & Oxygen Therapy: Patient Spontanous Breathing and Patient connected to face mask oxygen  Post-op Assessment: Report given to PACU RN and Post -op Vital signs reviewed and stable  Post vital signs: Reviewed and stable  Complications: No apparent anesthesia complications

## 2013-12-26 NOTE — Anesthesia Procedure Notes (Signed)
Anesthesia Regional Block:  Popliteal block  Pre-Anesthetic Checklist: ,, timeout performed, Correct Patient, Correct Site, Correct Laterality, Correct Procedure, Correct Position, site marked, Risks and benefits discussed,  Surgical consent,  Pre-op evaluation,  At surgeon's request and post-op pain management  Laterality: Right and Lower  Prep: chloraprep       Needles:  Injection technique: Single-shot  Needle Type: Stimiplex     Needle Length: 10cm 10 cm Needle Gauge: 21 and 21 G    Additional Needles:  Procedures: ultrasound guided (picture in chart) and nerve stimulator Popliteal block Narrative:  Injection made incrementally with aspirations every 5 mL.  Performed by: Personally  Anesthesiologist: Phillips GroutPeter Thirza Pellicano MD  Additional Notes: Patient tolerated the procedure well without complications

## 2013-12-26 NOTE — Progress Notes (Signed)
Pt sitting up on side of bed.  Encouraging pt to take deep breaths now and when he goes home.  Pt states he refuses to wear his CPAP.  I encouraged him to sleep with is head elevated today and tonight.  Pt and Pt's girlfriend, Zella BallRobin verbalize understanding.  Pt sent home with ice pack.  Pt told to keep Rt foot elevated and weight bearing as tolerated when walking.

## 2013-12-26 NOTE — Progress Notes (Signed)
Pt wakes up easily, he is falling a sleep while eating his crackers.  Pt states he hurts all over however he falls asleep immediately after he states this.

## 2013-12-26 NOTE — Anesthesia Preprocedure Evaluation (Signed)
Anesthesia Evaluation  Patient identified by MRN, date of birth, ID band Patient awake    Reviewed: Allergy & Precautions, H&P , NPO status , Patient's Chart, lab work & pertinent test results  Airway Mallampati: II TM Distance: >3 FB Neck ROM: Full    Dental  (+) Poor Dentition, Chipped, Dental Advisory Given   Pulmonary sleep apnea , Current Smoker,  breath sounds clear to auscultation        Cardiovascular hypertension, + angina + CAD, + Past MI and + Cardiac Stents Rhythm:Regular Rate:Normal     Neuro/Psych Anxiety Depression Chronic pain  Neuromuscular disease CVA, No Residual Symptoms    GI/Hepatic GERD-  ,(+)     substance abuse  alcohol use,   Endo/Other  negative endocrine ROS  Renal/GU Renal disease  negative genitourinary   Musculoskeletal negative musculoskeletal ROS (+)   Abdominal (+) + obese,   Peds  Hematology  (+) Blood dyscrasia, ,   Anesthesia Other Findings   Reproductive/Obstetrics                           Anesthesia Physical  Anesthesia Plan  ASA: III  Anesthesia Plan: Regional   Post-op Pain Management:    Induction:   Airway Management Planned: Simple Face Mask  Additional Equipment:   Intra-op Plan:   Post-operative Plan:   Informed Consent: I have reviewed the patients History and Physical, chart, labs and discussed the procedure including the risks, benefits and alternatives for the proposed anesthesia with the patient or authorized representative who has indicated his/her understanding and acceptance.   Dental advisory given  Plan Discussed with: CRNA  Anesthesia Plan Comments:         Anesthesia Quick Evaluation

## 2013-12-26 NOTE — Anesthesia Postprocedure Evaluation (Signed)
  Anesthesia Post-op Note  Patient: Leonard KettleDavid Alan Stoll  Procedure(s) Performed: Procedure(s) (LRB): RIGHT 2nd TOE AMPUTATION (Right)  Patient Location: PACU  Anesthesia Type: MAC combined with regional for post-op pain  Level of Consciousness: awake and alert   Airway and Oxygen Therapy: Patient Spontanous Breathing  Post-op Pain: mild  Post-op Assessment: Post-op Vital signs reviewed, Patient's Cardiovascular Status Stable, Respiratory Function Stable, Patent Airway and No signs of Nausea or vomiting  Last Vitals:  Filed Vitals:   12/26/13 1041  BP: 114/56  Pulse: 70  Temp: 36.6 C  Resp: 20    Post-op Vital Signs: stable   Complications: No apparent anesthesia complications

## 2013-12-26 NOTE — Progress Notes (Signed)
Dr. Acey Lavarignan made aware patient does not use his CPAP AT HOME.

## 2013-12-26 NOTE — Discharge Instructions (Signed)
Ice and elevation for swelling. You may put all of your weight on your right foot as comfort allows, Keep your dressing clean and dry. Leave your current dressing on and in place for the next 4 days. In 4 days you can remove your dressing and start getting your incision wet in the shower. Place a small amount of neosporin followed by band-aids daily over your incision starting in 4 days.

## 2013-12-26 NOTE — H&P (Signed)
Leonard Murray is an 53 y.o. male.   Chief Complaint:   Right 2nd toe infection HPI:   53 yo male with known osteomyelitis involving the 2nd toe on his right foot.  He has had a 3rd toe amputation for similar problems.  His 2nd toe has a wound at the tip and x-rays showing destruction of the bone at the tip consistent with osteo.  It is recommended that he undergo a 2nd toe amputation.  He understands this fully as well as the risks and benefits involved.  Past Medical History  Diagnosis Date  . CAD (coronary artery disease) 12/2007    AMI with stenting; retinal embolus 08/2005; stenting 03/2009  . Hyperlipidemia   . OSA (obstructive sleep apnea)     not using CPAP-feels suffocating  . GAD (generalized anxiety disorder)   . Depression   . Chronic back pain     injuries 1998, 2008, 01/2009  . Peripheral neuropathy   . Hypogonadism male     can't afford testosterone  . Anginal pain   . Myocardial infarction   . Stroke     lost vision at time of stroke - but regained vision - no deficits  . GERD (gastroesophageal reflux disease)   . Arthritis     knees and back  . Osteomyelitis     right second and third toes  . Hypertension     PT STATES HE HAS HAD PROBLEMS WITH LOW BLOOD PRESSURE AT TIMES AND HAS FALLEN--MOST RECENT HYPOTENSION WAS 09/20/13 AND WAS ADMITTED TO [facility] WITH OSTEOMYELITIS.  Marland Kitchen Acute renal failure 09/18/2013    RESOLVED BY DISCHARGE FROM [facility] ON 09/20/2013  . Hyperlipidemia   . Wound cellulitis 12-24-13    right 2nd toe  . Impaired vision     hx. loss of some visual field -past- no residual  . Anxiety     "drools more when stressed"    Past Surgical History  Procedure Laterality Date  . Bony pelvis surgery    . Bladder repair    . Fracture surgery    . Cardiac catheterization    . Coronary angioplasty    . Neck surgery      fusion--some limitations in neck movement  . Amputation Right 09/27/2013    Procedure: AMPUTATION RIGHT 3RD TOE ;  Surgeon: Kathryne Hitch, MD;  Location: [facility]S;  Service: Orthopedics;  Laterality: Right;  . Coronary angioplasty with stent placement      x2    Family History  Problem Relation Age of Onset  . Diabetes Mellitus II Mother   . Diabetes Mellitus II Father    Social History:  reports that he has been smoking Cigarettes.  He has a 60 pack-year smoking history. He has never used smokeless tobacco. He reports that he drinks alcohol. He reports that he does not use illicit drugs.  Allergies: No Known Allergies  Medications Prior to Admission  Medication Sig Dispense Refill  . alfuzosin (UROXATRAL) 10 MG 24 hr tablet Take 10 mg by mouth at bedtime.      . ALPRAZolam (XANAX) 1 MG tablet Take 1 mg by mouth 3 (three) times daily as needed. For anxiety      . amitriptyline (ELAVIL) 100 MG tablet Take 100 mg by mouth at bedtime.      Marland Kitchen aspirin EC 81 MG tablet Take 81 mg by mouth daily.      Marland Kitchen atorvastatin (LIPITOR) 40 MG tablet Take 40 mg by mouth every  morning.      . clopidogrel (PLAVIX) 75 MG tablet Take 75 mg by mouth daily with breakfast.      . morphine (MSIR) 15 MG tablet Take 15 mg by mouth 2 (two) times daily.      . nebivolol (BYSTOLIC) 5 MG tablet Take 5 mg by mouth every morning.      Marland Kitchen oxyCODONE (ROXICODONE) 15 MG immediate release tablet Take 15 mg by mouth every 4 (four) hours.      . pantoprazole (PROTONIX) 40 MG tablet Take 40 mg by mouth daily.      Marland Kitchen PARoxetine (PAXIL) 20 MG tablet Take 20 mg by mouth every morning.      . pregabalin (LYRICA) 100 MG capsule Take 100 mg by mouth 3 (three) times daily.      . nitroGLYCERIN (NITROSTAT) 0.4 MG SL tablet Place 0.4 mg under the tongue every 5 (five) minutes as needed for chest pain.      . OxyCODONE (OXYCONTIN) 40 mg T12A 12 hr tablet Take 40 mg by mouth every 12 (twelve) hours.      . tamsulosin (FLOMAX) 0.4 MG CAPS capsule Take 0.4 mg by mouth at bedtime.        Results for orders placed during the hospital encounter of 12/24/13 (from the past  48 hour(s))  CBC     Status: None   Collection Time    12/24/13  3:02 PM      Result Value Ref Range   WBC 7.3  4.0 - 10.5 K/uL   RBC 4.98  4.22 - 5.81 MIL/uL   Hemoglobin 15.5  13.0 - 17.0 g/dL   HCT 44.8  39.0 - 52.0 %   MCV 90.0  78.0 - 100.0 fL   MCH 31.1  26.0 - 34.0 pg   MCHC 34.6  30.0 - 36.0 g/dL   RDW 14.0  11.5 - 15.5 %   Platelets 199  150 - 400 K/uL  BASIC METABOLIC PANEL     Status: Abnormal   Collection Time    12/24/13  3:02 PM      Result Value Ref Range   Sodium 137  137 - 147 mEq/L   Potassium 4.4  3.7 - 5.3 mEq/L   Chloride 99  96 - 112 mEq/L   CO2 29  19 - 32 mEq/L   Glucose, Bld 133 (*) 70 - 99 mg/dL   BUN 10  6 - 23 mg/dL   Creatinine, Ser 0.79  0.50 - 1.35 mg/dL   Calcium 9.5  8.4 - 10.5 mg/dL   GFR calc non Af Amer >90  >90 mL/min   GFR calc Af Amer >90  >90 mL/min   Comment: (NOTE)     The eGFR has been calculated using the CKD EPI equation.     This calculation has not been validated in all clinical situations.     eGFR's persistently <90 mL/min signify possible Chronic Kidney     Disease.   No results found.  Review of Systems  All other systems reviewed and are negative.   Blood pressure 105/55, pulse 76, temperature 98 F (36.7 C), temperature source Oral, resp. rate 16, SpO2 95.00%. Physical Exam  Constitutional: He is oriented to person, place, and time. He appears well-developed and well-nourished.  HENT:  Head: Normocephalic and atraumatic.  Eyes: EOM are normal. Pupils are equal, round, and reactive to light.  Neck: Normal range of motion. Neck supple.  Cardiovascular: Normal rate and regular rhythm.  Respiratory: Effort normal and breath sounds normal.  GI: Soft. Bowel sounds are normal.  Musculoskeletal:       Feet:  Neurological: He is alert and oriented to person, place, and time.  Skin: Skin is warm and dry.  Psychiatric: He has a normal mood and affect.     Assessment/Plan Right foot 2nd toe with osteomyelitis 1)   To the OR today as an outpatient for an amputation of his right 2nd toe.  Mcarthur Rossetti 12/26/2013, 7:01 AM

## 2013-12-26 NOTE — Brief Op Note (Signed)
12/26/2013  7:51 AM  PATIENT:  Leonard Murray  53 y.o. male  PRE-OPERATIVE DIAGNOSIS:  Right 2nd toe osteomyelitis  POST-OPERATIVE DIAGNOSIS:  RIGHT 2ND TOE OSTEOMYELITIS  PROCEDURE:  Procedure(s): RIGHT 2nd TOE AMPUTATION (Right)  SURGEON:  Surgeon(s) and Role:    * Kathryne Hitchhristopher Y Patricia Perales, MD - Primary  PHYSICIAN ASSISTANT:   ASSISTANTS: none   ANESTHESIA:   regional  EBL:   minimal  BLOOD ADMINISTERED:none  DRAINS: none   LOCAL MEDICATIONS USED:  NONE  SPECIMEN:  No Specimen  DISPOSITION OF SPECIMEN:  N/A  COUNTS:  YES  TOURNIQUET:    DICTATION: .Other Dictation: Dictation Number [id number]  PLAN OF CARE: Discharge to home after PACU  PATIENT DISPOSITION:  PACU - hemodynamically stable.   Delay start of Pharmacological VTE agent (>24hrs) due to surgical blood loss or risk of bleeding: not applicable

## 2013-12-26 NOTE — Progress Notes (Signed)
Dr. Acey Lavarignan in- saw patient- made aware of patient's blood pressure- SA02S- O.K. To go to Short Stay

## 2013-12-27 ENCOUNTER — Encounter (HOSPITAL_COMMUNITY): Payer: Self-pay | Admitting: Orthopaedic Surgery

## 2013-12-27 NOTE — Op Note (Signed)
NAME:  [name], [name]                 ACCOUNT NO.:  [id number]  MEDICAL RECORD NO.:  [id number]  LOCATION:  [facility]                         [facility]  PHYSICIAN:  Vanita PandaChristopher Y. Magnus IvanBlackman, M.D.DATE OF BIRTH:  07/03/1961  DATE OF PROCEDURE:  12/26/2013 DATE OF DISCHARGE:  12/26/2013                              OPERATIVE REPORT   PREOPERATIVE DIAGNOSIS:  Right foot second toe distal phalanx osteomyelitis.  POSTOPERATIVE DIAGNOSIS:  Right foot second toe distal phalanx osteomyelitis.  PROCEDURE:  Right foot second toe amputation to the level the metatarsophalangeal joint.  SURGEON:  Vanita PandaChristopher Y. Magnus IvanBlackman, M.D.  ANESTHESIA:  Popliteal block with mask ventilation and IV sedation.  ESTIMATED BLOOD LOSS:  Minimal.  COMPLICATIONS:  None.  INDICATIONS:  [name] is a 53 year old gentleman with acute osteomyelitis involving his right foot second toe.  He has previously had this on his 3rd toe and had a successful amputation of 3rd toe several months ago.  The 2nd toe has had a wound at the tip of it and now x-rays show bony destruction consistent with osteo.  It is recommended that he undergo a second toe amputation.  He understands this fully and does wish to proceed.  DESCRIPTION OF PROCEDURE:  After informed consent was obtained, appropriate right foot was marked, and right 2nd toe was marked.  A popliteal block was obtained.  He was then brought to the operating room, placed supine on the operating table.  Mask ventilation and IV sedation was obtained.  His right foot was prepped and draped with Betadine paint.  Time-out was called and he was identified as correct patient, correct right foot.  I then used an Esmarch as a local tourniquet around the ankle and was able to use a #10 blade, and ellipse out the second toe with no difficulty at all.  I then rongeured the bone back to the level of the MTP joint and got back to smooth edge.  He had good bleeding tissue in all of  the [facility].  I irrigated the tissue with normal saline solution and reapproximated the skin with interrupted 2-0 nylon suture.  Xeroform and well-padded sterile dressing was applied.  He was taken to the recovery room in stable condition. All final counts were correct.  There were no complications noted.    Vanita Pandahristopher Y. Magnus IvanBlackman, M.D.    CYB/MEDQ  D:  12/26/2013  T:  12/27/2013  Job:  [id number]

## 2014-01-02 ENCOUNTER — Telehealth: Payer: Self-pay

## 2014-01-02 ENCOUNTER — Telehealth: Payer: Self-pay | Admitting: Internal Medicine

## 2014-01-02 NOTE — Telephone Encounter (Signed)
Patient is requesting a refill on his Elavil Also is being seen tomorrow at [facility] for a problem with his feet

## 2014-01-02 NOTE — Telephone Encounter (Signed)
Pt called requesting a referral, please contact pt

## 2014-01-31 ENCOUNTER — Other Ambulatory Visit: Payer: Self-pay | Admitting: Cardiovascular Disease

## 2014-01-31 NOTE — Telephone Encounter (Signed)
Rx refill sent to patient pharmacy   

## 2014-02-04 ENCOUNTER — Other Ambulatory Visit: Payer: Self-pay | Admitting: *Deleted

## 2014-02-04 NOTE — Telephone Encounter (Signed)
Prior Auth for [employer]HealthBystolic 10mg  1/2 tablet daily [facility]

## 2014-02-18 ENCOUNTER — Telehealth: Payer: Self-pay | Admitting: Cardiovascular Disease

## 2014-02-18 NOTE — Telephone Encounter (Signed)
FORWARD TO WANDA CMA 

## 2014-02-18 NOTE — Telephone Encounter (Signed)
Submitted prior auth on June 20 for bystolic  Wants to know status.  Has not gotten anything back.  Please call

## 2014-02-19 NOTE — Telephone Encounter (Signed)
Notified pharmacy that referral is still pending.

## 2014-02-20 ENCOUNTER — Telehealth: Payer: Self-pay | Admitting: *Deleted

## 2014-02-20 NOTE — Telephone Encounter (Signed)
Faxed prior Authorization request for [facility].

## 2014-03-26 ENCOUNTER — Other Ambulatory Visit: Payer: Self-pay | Admitting: Cardiovascular Disease

## 2014-03-26 NOTE — Telephone Encounter (Signed)
Rx was sent to pharmacy electronically. 

## 2014-04-02 ENCOUNTER — Telehealth: Payer: Self-pay | Admitting: Cardiovascular Disease

## 2014-04-02 NOTE — Telephone Encounter (Signed)
Forwarded to Dr. Tresa EndoKelly for review.

## 2014-04-02 NOTE — Telephone Encounter (Signed)
I am away on vacation. He can switch to metoprolol succinate 25 mg daily

## 2014-04-02 NOTE — Telephone Encounter (Signed)
Kirt BoysMolly called stating that she sent over a prior authorization in July for bystolic 10mg  but never received a response and now she would get a response of the bystolic and like to speak to Dr. Tresa EndoKelly about switching beta blockers for the pt. Please call  Thanks

## 2014-04-03 ENCOUNTER — Other Ambulatory Visit: Payer: Self-pay | Admitting: *Deleted

## 2014-04-03 MED ORDER — METOPROLOL SUCCINATE ER 25 MG PO TB24: 25.0000 mg | ORAL_TABLET | Freq: Every day | ORAL | Status: DC

## 2014-04-03 NOTE — Telephone Encounter (Signed)
Spoke with Kirt BoysMolly gave her the new prescription for metoprolol succ 25 mg per Dr. Tresa EndoKelly.

## 2014-04-06 ENCOUNTER — Other Ambulatory Visit: Payer: Self-pay | Admitting: Internal Medicine

## 2014-07-23 ENCOUNTER — Other Ambulatory Visit: Payer: Self-pay

## 2014-07-23 MED ORDER — ATORVASTATIN CALCIUM 40 MG PO TABS: ORAL_TABLET | ORAL | Status: DC

## 2014-07-23 NOTE — Telephone Encounter (Signed)
Rx sent to pharmacy   

## 2014-10-16 ENCOUNTER — Other Ambulatory Visit: Payer: Self-pay

## 2014-10-16 MED ORDER — ATORVASTATIN CALCIUM 40 MG PO TABS: 40.0000 mg | ORAL_TABLET | Freq: Every day | ORAL | Status: DC

## 2014-10-16 NOTE — Telephone Encounter (Signed)
Rx(s) sent to pharmacy electronically.  

## 2014-10-20 ENCOUNTER — Other Ambulatory Visit: Payer: Self-pay | Admitting: *Deleted

## 2014-10-20 MED ORDER — CLOPIDOGREL BISULFATE 75 MG PO TABS: 75.0000 mg | ORAL_TABLET | Freq: Every day | ORAL | Status: DC

## 2014-10-20 NOTE — Telephone Encounter (Signed)
ERX sent to pharmacy 

## 2014-11-19 ENCOUNTER — Other Ambulatory Visit: Payer: Self-pay | Admitting: Cardiovascular Disease

## 2014-11-19 NOTE — Telephone Encounter (Signed)
Rx(s) sent to pharmacy electronically.  

## 2014-12-09 ENCOUNTER — Other Ambulatory Visit: Payer: Self-pay

## 2014-12-09 DIAGNOSIS — F32A Depression, unspecified: Secondary | ICD-10-CM

## 2014-12-09 DIAGNOSIS — F329 Major depressive disorder, single episode, unspecified: Secondary | ICD-10-CM

## 2014-12-09 DIAGNOSIS — K219 Gastro-esophageal reflux disease without esophagitis: Secondary | ICD-10-CM

## 2014-12-09 NOTE — Telephone Encounter (Signed)
Patient called requesting a refill on his paxil and protnix Can we refill both

## 2014-12-09 NOTE — Telephone Encounter (Signed)
One month supply and make follow up

## 2014-12-10 MED ORDER — PAROXETINE HCL 20 MG PO TABS: 20.0000 mg | ORAL_TABLET | Freq: Every morning | ORAL | Status: DC

## 2014-12-10 MED ORDER — PANTOPRAZOLE SODIUM 40 MG PO TBEC: 40.0000 mg | DELAYED_RELEASE_TABLET | Freq: Every day | ORAL | Status: DC

## 2014-12-10 NOTE — Telephone Encounter (Signed)
Refilled protonix and paxil per Dr. Orpah CobbAdvani and patient has appointment scheduled

## 2014-12-22 ENCOUNTER — Ambulatory Visit: Payer: Commercial Managed Care - HMO | Attending: Internal Medicine | Admitting: Internal Medicine

## 2014-12-22 ENCOUNTER — Encounter: Payer: Self-pay | Admitting: Internal Medicine

## 2014-12-22 VITALS — BP 127/80 | HR 68 | Temp 98.0°F | Resp 16 | Wt 239.4 lb

## 2014-12-22 DIAGNOSIS — F32A Depression, unspecified: Secondary | ICD-10-CM

## 2014-12-22 DIAGNOSIS — F17200 Nicotine dependence, unspecified, uncomplicated: Secondary | ICD-10-CM

## 2014-12-22 DIAGNOSIS — I2510 Atherosclerotic heart disease of native coronary artery without angina pectoris: Secondary | ICD-10-CM | POA: Diagnosis not present

## 2014-12-22 DIAGNOSIS — Z720 Tobacco use: Secondary | ICD-10-CM | POA: Diagnosis not present

## 2014-12-22 DIAGNOSIS — G8929 Other chronic pain: Secondary | ICD-10-CM | POA: Diagnosis not present

## 2014-12-22 DIAGNOSIS — F329 Major depressive disorder, single episode, unspecified: Secondary | ICD-10-CM | POA: Diagnosis present

## 2014-12-22 DIAGNOSIS — K219 Gastro-esophageal reflux disease without esophagitis: Secondary | ICD-10-CM | POA: Insufficient documentation

## 2014-12-22 MED ORDER — AMITRIPTYLINE HCL 100 MG PO TABS: 100.0000 mg | ORAL_TABLET | Freq: Every day | ORAL | Status: AC

## 2014-12-22 MED ORDER — PANTOPRAZOLE SODIUM 40 MG PO TBEC: 40.0000 mg | DELAYED_RELEASE_TABLET | Freq: Every day | ORAL | Status: DC

## 2014-12-22 MED ORDER — PAROXETINE HCL 20 MG PO TABS: 20.0000 mg | ORAL_TABLET | Freq: Every morning | ORAL | Status: DC

## 2014-12-22 NOTE — Progress Notes (Signed)
MRN: [id number] Name: Leonard Murray  Sex: male Age: 54 y.o. DOB: January 17, 1961  Allergies: Review of patient's allergies indicates no known allergies.  Chief Complaint  Patient presents with  . Follow-up    HPI: Patient is 54 y.o. male who history of CAD, hypertension, GERD, chronic back pain, depression comes today for followup and is requesting refill on his medication, apparently patient was following her with the cardiologist and he is going to reschedule appointment, he is also currently following up with pain management and is on narcotic medication, patient currently denies any acute symptoms, denies any headache dizziness chest and shortness of breath.  Past Medical History  Diagnosis Date  . CAD (coronary artery disease) 12/2007    AMI with stenting; retinal embolus 08/2005; stenting 03/2009  . Hyperlipidemia   . OSA (obstructive sleep apnea)     not using CPAP-feels suffocating  . GAD (generalized anxiety disorder)   . Depression   . Chronic back pain     injuries 1998, 2008, 01/2009  . Peripheral neuropathy   . Hypogonadism male     can't afford testosterone  . Anginal pain   . Myocardial infarction   . Stroke     lost vision at time of stroke - but regained vision - no deficits  . GERD (gastroesophageal reflux disease)   . Arthritis     knees and back  . Osteomyelitis     right second and third toes  . Hypertension     PT STATES HE HAS HAD PROBLEMS WITH LOW BLOOD PRESSURE AT TIMES AND HAS FALLEN--MOST RECENT HYPOTENSION WAS 09/20/13 AND WAS ADMITTED TO [facility] WITH OSTEOMYELITIS.  Marland Kitchen Acute renal failure 09/18/2013    RESOLVED BY DISCHARGE FROM [facility] 09/20/2013  . Hyperlipidemia   . Wound cellulitis 12-24-13    right 2nd toe  . Impaired vision     hx. loss of some visual field -past- no residual  . Anxiety     "drools more when stressed"    Past Surgical History  Procedure Laterality Date  . Bony pelvis surgery    . Bladder repair    . Fracture  surgery    . Cardiac catheterization    . Coronary angioplasty    . Neck surgery      fusion--some limitations in neck movement  . Amputation Right 09/27/2013    Procedure: AMPUTATION RIGHT 3RD TOE ;  Surgeon: Kathryne Hitch, MD;  Location: [facility];  Service: Orthopedics;  Laterality: Right;  . Coronary angioplasty with stent placement      x2  . Amputation Right 12/26/2013    Procedure: RIGHT 2nd TOE AMPUTATION;  Surgeon: Kathryne Hitch, MD;  Location: [facility];  Service: Orthopedics;  Laterality: Right;      Medication List       This list is accurate as of: 12/22/14  1:33 PM.  Always use your most recent med list.               alfuzosin 10 MG 24 hr tablet  Commonly known as:  UROXATRAL  Take 10 mg by mouth at bedtime.     ALPRAZolam 1 MG tablet  Commonly known as:  XANAX  Take 1 mg by mouth 3 (three) times daily as needed. For anxiety     amitriptyline 100 MG tablet  Commonly known as:  ELAVIL  Take 1 tablet (100 mg total) by mouth at bedtime.     aspirin EC 81 MG tablet  Take 81 mg by mouth daily.     atorvastatin 40 MG tablet  Commonly known as:  LIPITOR  Take 1 tablet (40 mg total) by mouth daily. NEED APPOINTMENT FOR FUTURE REFILLS     clopidogrel 75 MG tablet  Commonly known as:  PLAVIX  Take 1 tablet (75 mg total) by mouth daily. MUST KEEP APPOINTMENT 01/05/2015 WITH DR Tresa EndoKELLY FOR FUTURE REFILLS     metoprolol succinate 25 MG 24 hr tablet  Commonly known as:  TOPROL XL  Take 1 tablet (25 mg total) by mouth daily.     morphine 15 MG tablet  Commonly known as:  MSIR  Take 15 mg by mouth 2 (two) times daily.     nebivolol 5 MG tablet  Commonly known as:  BYSTOLIC  Take 5 mg by mouth every morning.     nitroGLYCERIN 0.4 MG SL tablet  Commonly known as:  NITROSTAT  Place 0.4 mg under the tongue every 5 (five) minutes as needed for chest pain.     OXYCONTIN 40 mg T12a 12 hr tablet  Generic drug:  OxyCODONE  Take 40 mg by mouth every 12  (twelve) hours.     oxyCODONE 5 MG immediate release tablet  Commonly known as:  ROXICODONE  Take 2 tablets (10 mg total) by mouth every 4 (four) hours as needed for severe pain.     oxyCODONE 15 MG immediate release tablet  Commonly known as:  ROXICODONE  Take 15 mg by mouth every 4 (four) hours.     pantoprazole 40 MG tablet  Commonly known as:  PROTONIX  Take 1 tablet (40 mg total) by mouth daily.     PARoxetine 20 MG tablet  Commonly known as:  PAXIL  Take 1 tablet (20 mg total) by mouth every morning.     pregabalin 100 MG capsule  Commonly known as:  LYRICA  Take 100 mg by mouth 3 (three) times daily.     tamsulosin 0.4 MG Caps capsule  Commonly known as:  FLOMAX  Take 0.4 mg by mouth at bedtime.        Meds ordered this encounter  Medications  . amitriptyline (ELAVIL) 100 MG tablet    Sig: Take 1 tablet (100 mg total) by mouth at bedtime.    Dispense:  30 tablet    Refill:  3  . PARoxetine (PAXIL) 20 MG tablet    Sig: Take 1 tablet (20 mg total) by mouth every morning.    Dispense:  30 tablet    Refill:  3  . pantoprazole (PROTONIX) 40 MG tablet    Sig: Take 1 tablet (40 mg total) by mouth daily.    Dispense:  30 tablet    Refill:  3    Immunization History  Administered Date(s) Administered  . Influenza Split 09/25/2012  . Influenza,inj,Quad PF,36+ Mos 05/24/2013  . Pneumococcal Polysaccharide-23 04/10/2009  . Tdap 01/21/2008    Family History  Problem Relation Age of Onset  . Diabetes Mellitus II Mother   . Diabetes Mellitus II Father     History  Substance Use Topics  . Smoking status: Heavy Tobacco Smoker -- 2.00 packs/day for 30 years    Types: Cigarettes  . Smokeless tobacco: Never Used  . Alcohol Use: 0.0 oz/week     Comment: maybe one or two beers a day    Review of Systems   As noted in HPI  Filed Vitals:   12/22/14 1149  BP: 127/80  Pulse: 68  Temp: 98 F (36.7 C)  Resp: 16    Physical Exam  Physical Exam    Constitutional: No distress.  Cardiovascular: Normal rate and regular rhythm.   Pulmonary/Chest: Breath sounds normal. No respiratory distress. He has no wheezes. He has no rales.  Musculoskeletal: He exhibits no edema.    CBC    Component Value Date/Time   WBC 7.3 12/24/2013 1502   WBC 8.3 12/01/2011 1207   RBC 4.98 12/24/2013 1502   RBC 4.43* 12/01/2011 1207   HGB 15.5 12/24/2013 1502   HGB 14.6 12/01/2011 1207   HCT 44.8 12/24/2013 1502   HCT 44.2 12/01/2011 1207   PLT 199 12/24/2013 1502   MCV 90.0 12/24/2013 1502   MCV 99.7* 12/01/2011 1207   LYMPHSABS 2.9 09/19/2013 0302   MONOABS 0.6 09/19/2013 0302   EOSABS 0.3 09/19/2013 0302   BASOSABS 0.1 09/19/2013 0302    CMP     Component Value Date/Time   NA 137 12/24/2013 1502   K 4.4 12/24/2013 1502   CL 99 12/24/2013 1502   CO2 29 12/24/2013 1502   GLUCOSE 133* 12/24/2013 1502   BUN 10 12/24/2013 1502   CREATININE 0.79 12/24/2013 1502   CREATININE 0.78 03/28/2013 1143   CALCIUM 9.5 12/24/2013 1502   PROT 6.3 09/19/2013 0302   ALBUMIN 3.0* 09/19/2013 0302   AST 17 09/19/2013 0302   ALT 17 09/19/2013 0302   ALKPHOS 81 09/19/2013 0302   BILITOT 0.2* 09/19/2013 0302   GFRNONAA >90 12/24/2013 1502   GFRAA >90 12/24/2013 1502    Lab Results  Component Value Date/Time   CHOL 168 09/25/2012 04:11 PM    Lab Results  Component Value Date/Time   HGBA1C 5.6 09/18/2013 06:59 PM    Lab Results  Component Value Date/Time   AST 17 09/19/2013 03:02 AM    Assessment and Plan  Depression - Plan: patient denies any SI or HI , given refill on PARoxetine (PAXIL) 20 MG tablet  Gastroesophageal reflux disease, esophagitis presence not specified - Plan: lifestyle modification, continue with current meds pantoprazole (PROTONIX) 40 MG tablet  CAD in native artery/hypertension Currently patient is on aspirin, statin,Bystolic, Plavix, patient to follow up with his cardiologist  Chronic pain Currently following up with  pain management  Smoking Counseled patient to quit smoking.    Return in about 4 months (around 04/24/2015), or if symptoms worsen or fail to improve.   This note has been created with Education officer, environmentalDragon speech recognition software and smart phrase technology. Any transcriptional errors are unintentional.    Doris CheadleADVANI, Ramya Vanbergen, MD

## 2014-12-22 NOTE — Progress Notes (Signed)
Patient here for follow up and refills on his medication

## 2014-12-22 NOTE — Patient Instructions (Signed)
Smoking Cessation Quitting smoking is important to your health and has many advantages. However, it is not always easy to quit since nicotine is a very addictive drug. Oftentimes, people try 3 times or more before being able to quit. This document explains the best ways for you to prepare to quit smoking. Quitting takes hard work and a lot of effort, but you can do it. ADVANTAGES OF QUITTING SMOKING  You will live longer, feel better, and live better.  Your body will feel the impact of quitting smoking almost immediately.  Within 20 minutes, blood pressure decreases. Your pulse returns to its normal level.  After 8 hours, carbon monoxide levels in the blood return to normal. Your oxygen level increases.  After 24 hours, the chance of having a heart attack starts to decrease. Your breath, hair, and body stop smelling like smoke.  After 48 hours, damaged nerve endings begin to recover. Your sense of taste and smell improve.  After 72 hours, the body is virtually free of nicotine. Your bronchial tubes relax and breathing becomes easier.  After 2 to 12 weeks, lungs can hold more air. Exercise becomes easier and circulation improves.  The risk of having a heart attack, stroke, cancer, or lung disease is greatly reduced.  After 1 year, the risk of coronary heart disease is cut in half.  After 5 years, the risk of stroke falls to the same as a nonsmoker.  After 10 years, the risk of lung cancer is cut in half and the risk of other cancers decreases significantly.  After 15 years, the risk of coronary heart disease drops, usually to the level of a nonsmoker.  If you are pregnant, quitting smoking will improve your chances of having a healthy baby.  The people you live with, especially any children, will be healthier.  You will have extra money to spend on things other than cigarettes. QUESTIONS TO THINK ABOUT BEFORE ATTEMPTING TO QUIT You may want to talk about your answers with your  health care provider.  Why do you want to quit?  If you tried to quit in the past, what helped and what did not?  What will be the most difficult situations for you after you quit? How will you plan to handle them?  Who can help you through the tough times? Your family? Friends? A health care provider?  What pleasures do you get from smoking? What ways can you still get pleasure if you quit? Here are some questions to ask your health care provider:  How can you help me to be successful at quitting?  What medicine do you think would be best for me and how should I take it?  What should I do if I need more help?  What is smoking withdrawal like? How can I get information on withdrawal? GET READY  Set a quit date.  Change your environment by getting rid of all cigarettes, ashtrays, matches, and lighters in your home, car, or work. Do not let people smoke in your home.  Review your past attempts to quit. Think about what worked and what did not. GET SUPPORT AND ENCOURAGEMENT You have a better chance of being successful if you have help. You can get support in many ways.  Tell your family, friends, and coworkers that you are going to quit and need their support. Ask them not to smoke around you.  Get individual, group, or telephone counseling and support. Programs are available at local hospitals and health centers. Call   your local health department for information about programs in your area.  Spiritual beliefs and practices may help some smokers quit.  Download a "quit meter" on your computer to keep track of quit statistics, such as how long you have gone without smoking, cigarettes not smoked, and money saved.  Get a self-help book about quitting smoking and staying off tobacco. LEARN NEW SKILLS AND BEHAVIORS  Distract yourself from urges to smoke. Talk to someone, go for a walk, or occupy your time with a task.  Change your normal routine. Take a different route to work.  Drink tea instead of coffee. Eat breakfast in a different place.  Reduce your stress. Take a hot bath, exercise, or read a book.  Plan something enjoyable to do every day. Reward yourself for not smoking.  Explore interactive web-based programs that specialize in helping you quit. GET MEDICINE AND USE IT CORRECTLY Medicines can help you stop smoking and decrease the urge to smoke. Combining medicine with the above behavioral methods and support can greatly increase your chances of successfully quitting smoking.  Nicotine replacement therapy helps deliver nicotine to your body without the negative effects and risks of smoking. Nicotine replacement therapy includes nicotine gum, lozenges, inhalers, nasal sprays, and skin patches. Some may be available over-the-counter and others require a prescription.  Antidepressant medicine helps people abstain from smoking, but how this works is unknown. This medicine is available by prescription.  Nicotinic receptor partial agonist medicine simulates the effect of nicotine in your brain. This medicine is available by prescription. Ask your health care provider for advice about which medicines to use and how to use them based on your health history. Your health care provider will tell you what side effects to look out for if you choose to be on a medicine or therapy. Carefully read the information on the package. Do not use any other product containing nicotine while using a nicotine replacement product.  RELAPSE OR DIFFICULT SITUATIONS Most relapses occur within the first 3 months after quitting. Do not be discouraged if you start smoking again. Remember, most people try several times before finally quitting. You may have symptoms of withdrawal because your body is used to nicotine. You may crave cigarettes, be irritable, feel very hungry, cough often, get headaches, or have difficulty concentrating. The withdrawal symptoms are only temporary. They are strongest  when you first quit, but they will go away within 10-14 days. To reduce the chances of relapse, try to:  Avoid drinking alcohol. Drinking lowers your chances of successfully quitting.  Reduce the amount of caffeine you consume. Once you quit smoking, the amount of caffeine in your body increases and can give you symptoms, such as a rapid heartbeat, sweating, and anxiety.  Avoid smokers because they can make you want to smoke.  Do not let weight gain distract you. Many smokers will gain weight when they quit, usually less than 10 pounds. Eat a healthy diet and stay active. You can always lose the weight gained after you quit.  Find ways to improve your mood other than smoking. FOR MORE INFORMATION  www.smokefree.gov  Document Released: 07/26/2001 Document Revised: 12/16/2013 Document Reviewed: 11/10/2011 ExitCare Patient Information 2015 [employer]. This information is not intended to replace advice given to you by your health care provider. Make sure you discuss any questions you have with your health care provider.  

## 2014-12-26 ENCOUNTER — Other Ambulatory Visit: Payer: Self-pay | Admitting: Cardiovascular Disease

## 2015-01-05 ENCOUNTER — Ambulatory Visit: Payer: Commercial Managed Care - HMO | Admitting: Cardiovascular Disease

## 2015-01-13 ENCOUNTER — Other Ambulatory Visit: Payer: Self-pay | Admitting: Cardiovascular Disease

## 2015-01-13 NOTE — Telephone Encounter (Signed)
Rx(s) sent to pharmacy electronically.  

## 2015-01-18 IMAGING — CR DG CHEST 2V
2 series · 2 of 2 positions shown · non-contrast
Comparison: June 16, 2012

CLINICAL DATA: Hypotension

EXAM:
CHEST  2 VIEW

[w chest lat]
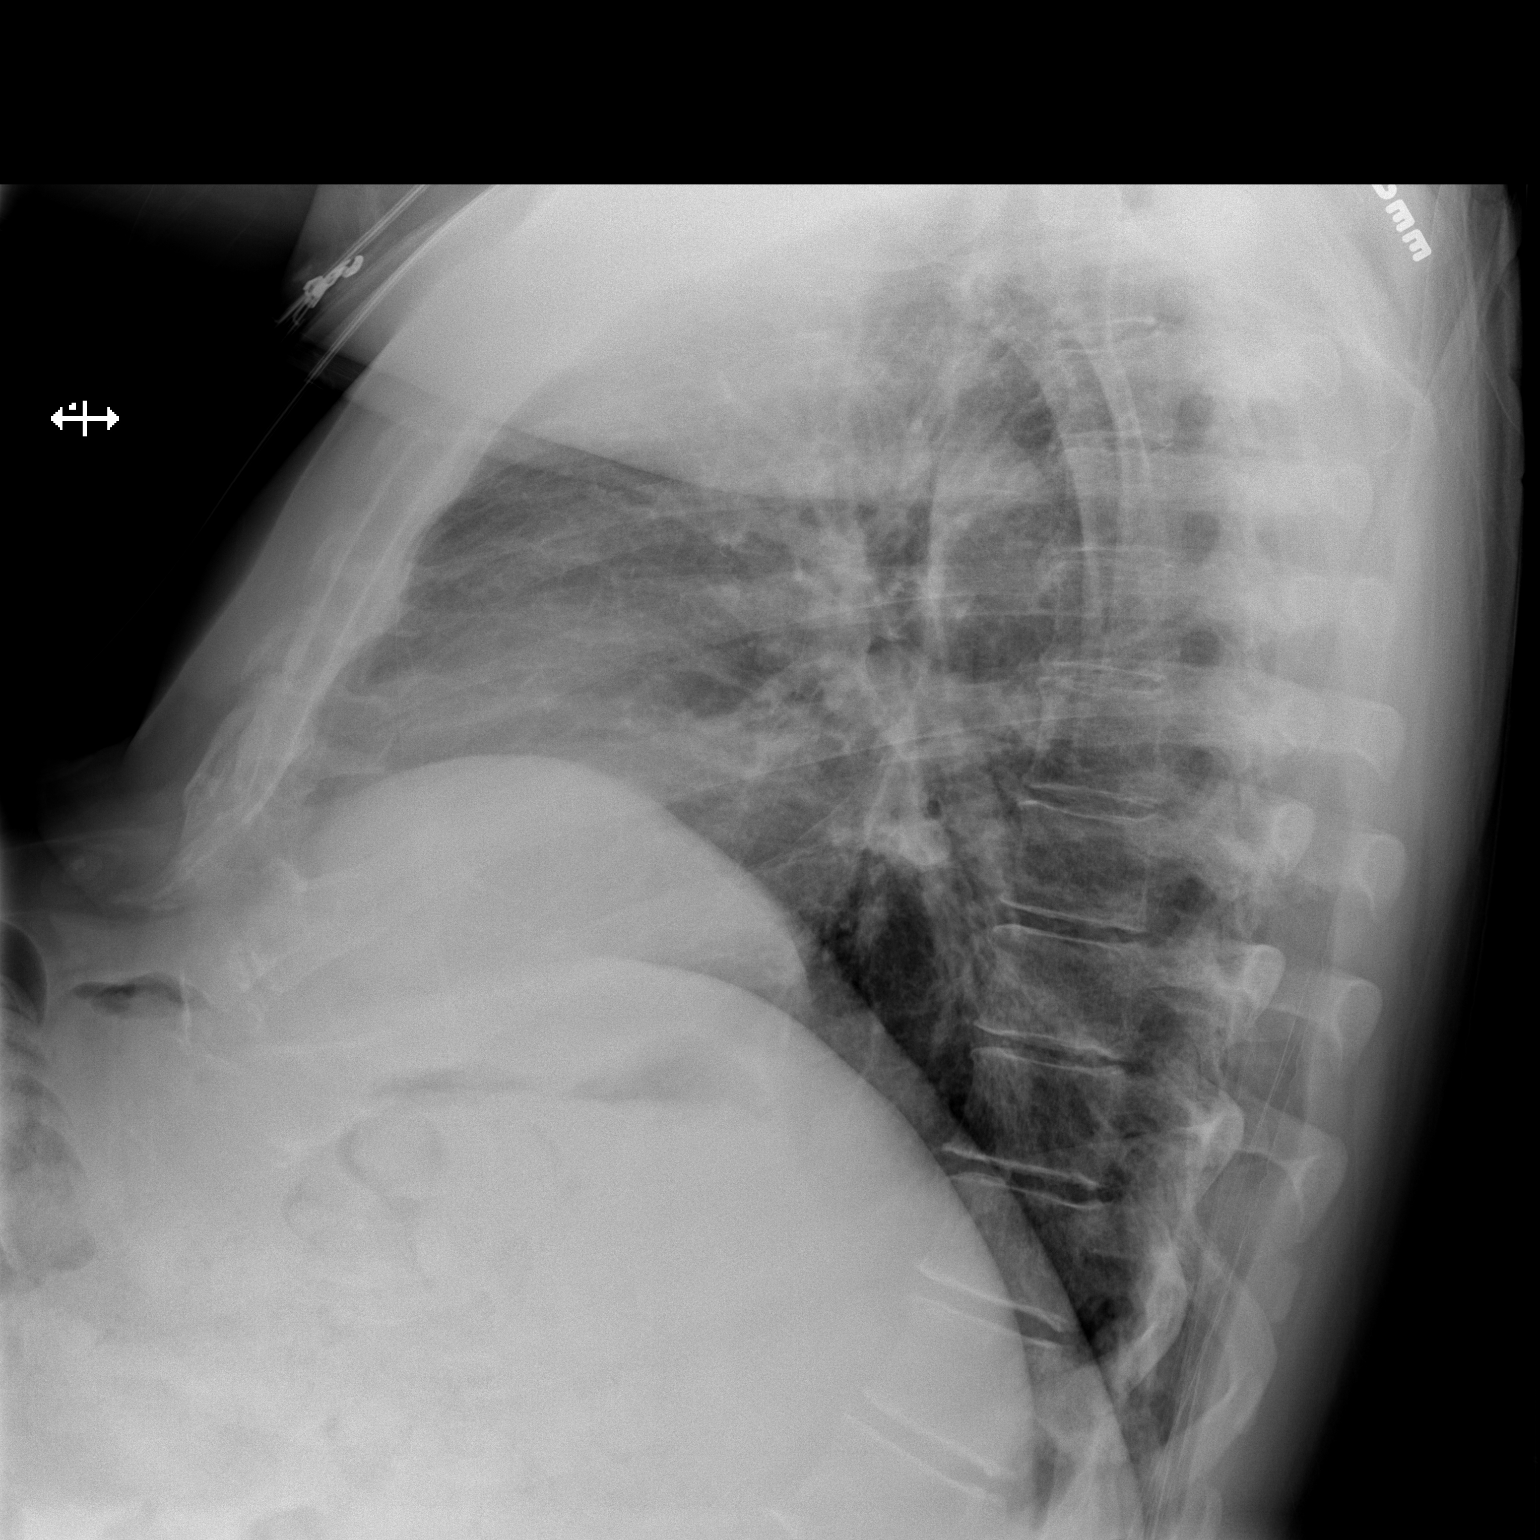

[x chest ap]
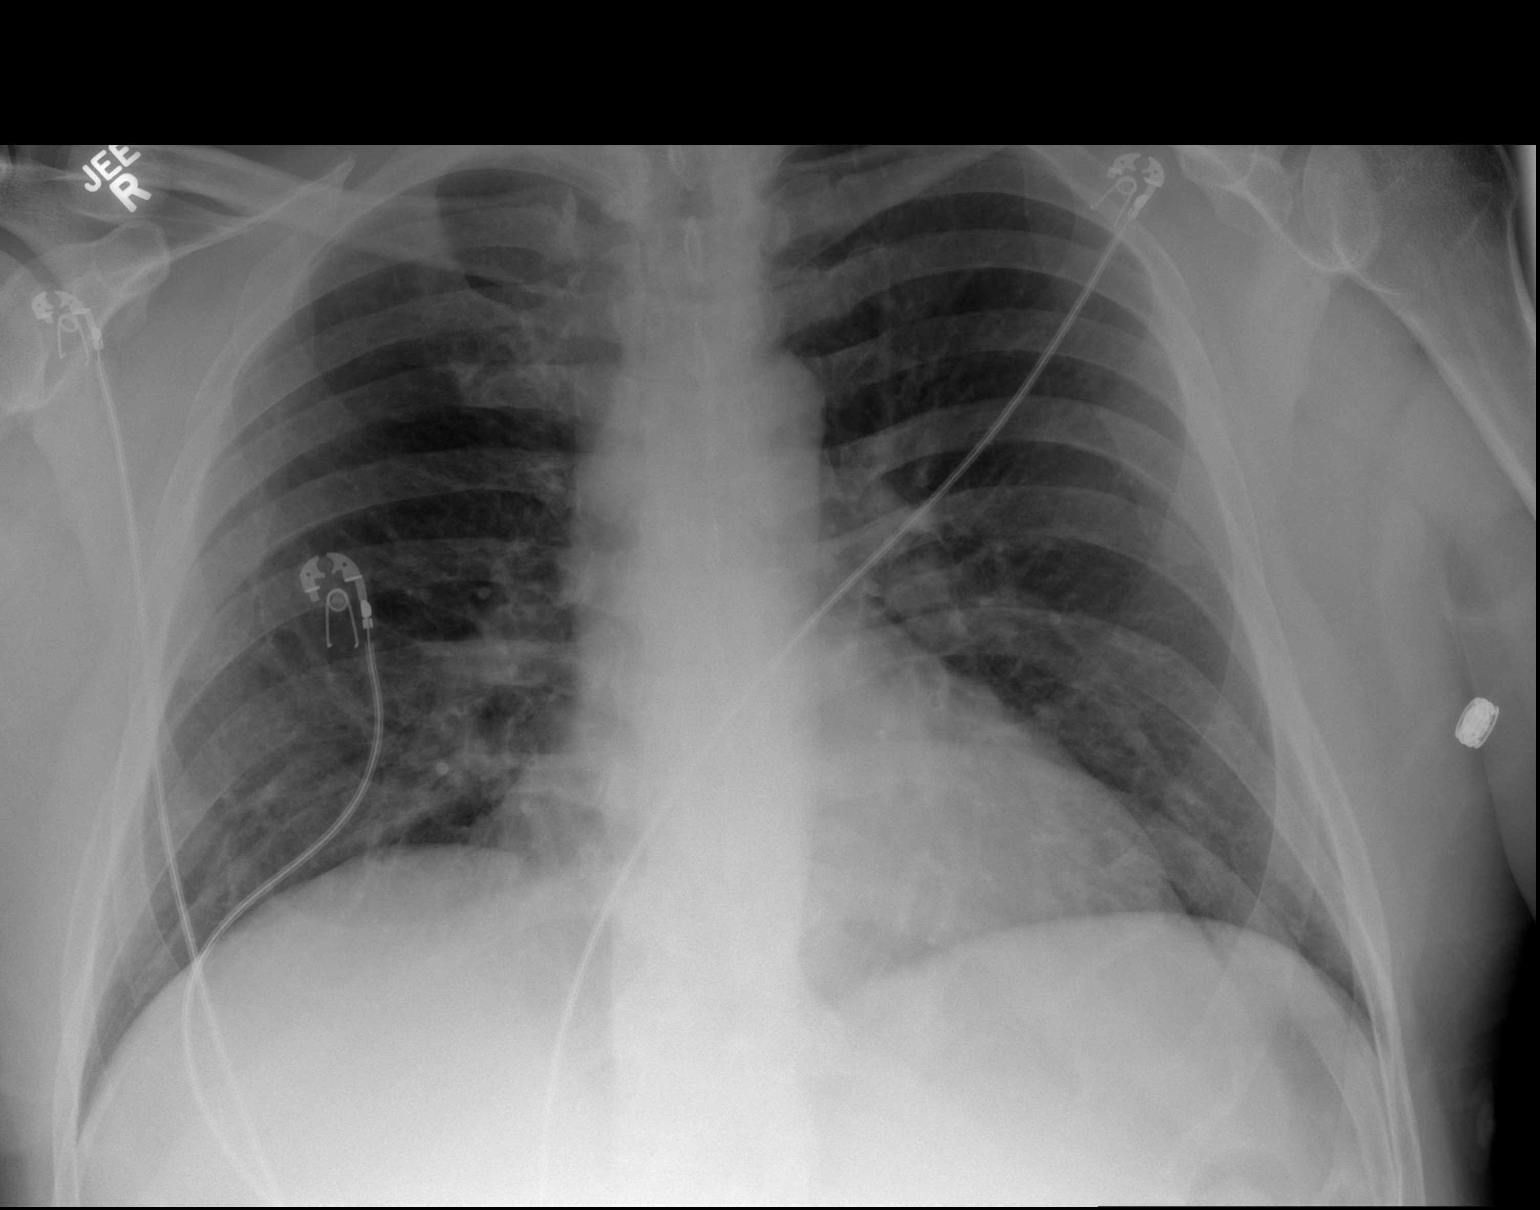

[2 of 2 positions shown; findings below may reference images not displayed]

FINDINGS: There is no edema or consolidation. Heart size and pulmonary
vascularity are normal. No adenopathy. No bone lesions.
IMPRESSION: No edema or consolidation.

## 2015-01-18 IMAGING — CR DG TOE 3RD 2+V*R*
3 series · 3 of 3 positions shown · non-contrast
Comparison: May 30, 2013

CLINICAL DATA: Ulcer with open wound

EXAM:
RIGHT THIRD TOE

[x toes ap right]
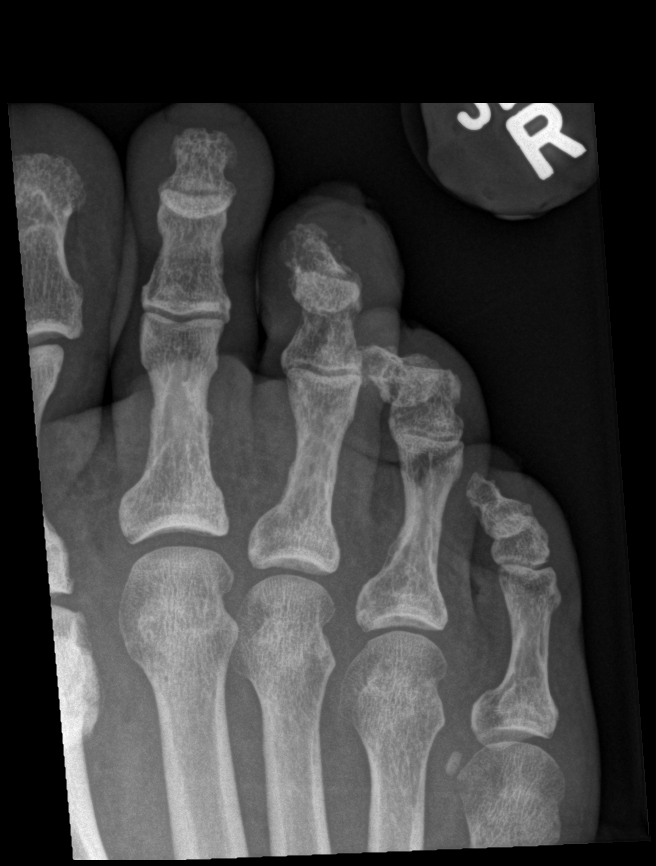

[x toes obl right]
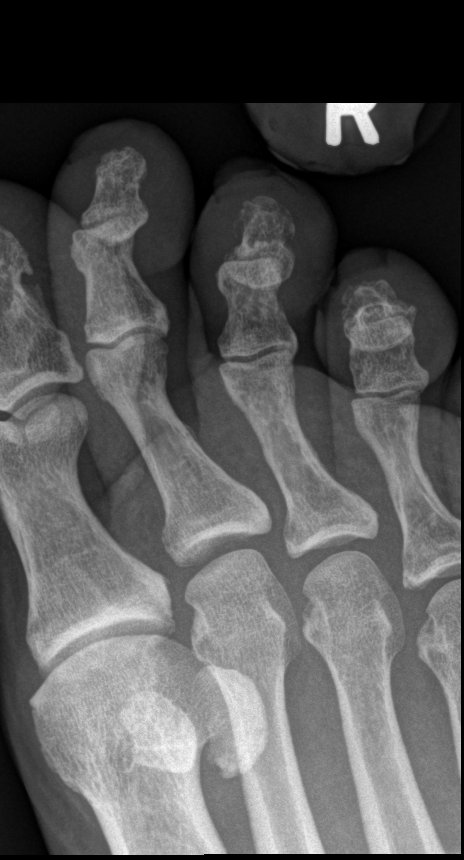

[x toes lat right]
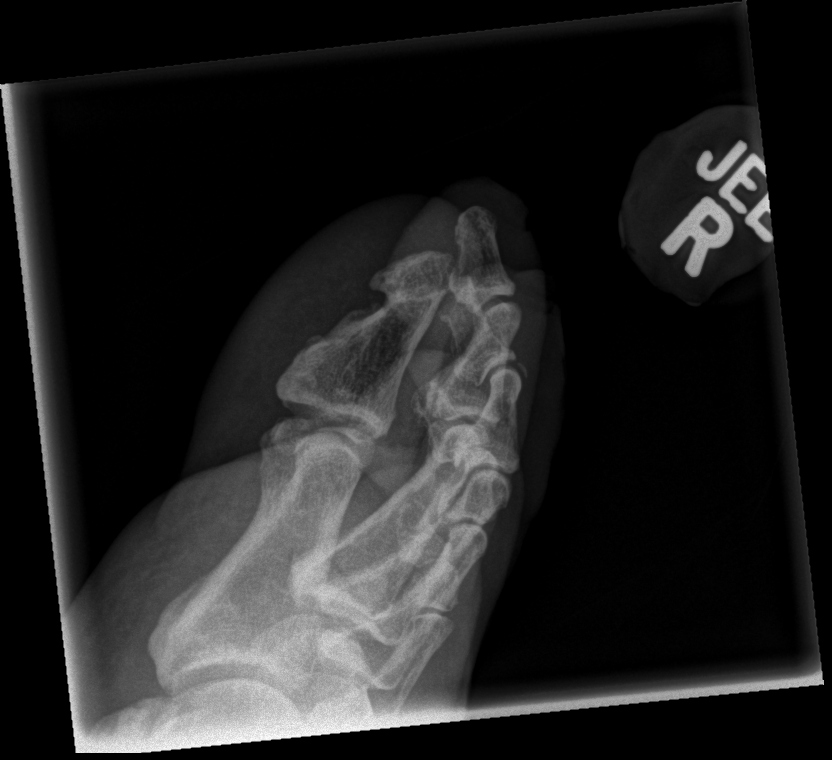

[3 of 3 positions shown; findings below may reference images not displayed]

FINDINGS: Frontal, oblique, and lateral views were obtained. There is evidence
of a degree of bony destruction in the third distal phalanx. There
is no well-defined fracture or dislocation. Joint spaces appear
intact.
IMPRESSION: Evidence of bony destruction involving much of the third distal
phalanx. No acute fracture or dislocation.

## 2015-01-19 IMAGING — CR DG CHEST 1V PORT
1 series · 1 of 1 positions shown · non-contrast
Comparison: Prior radiograph from 09/18/2013

CLINICAL DATA: Line placement

EXAM:
PORTABLE CHEST - 1 VIEW

[AP]
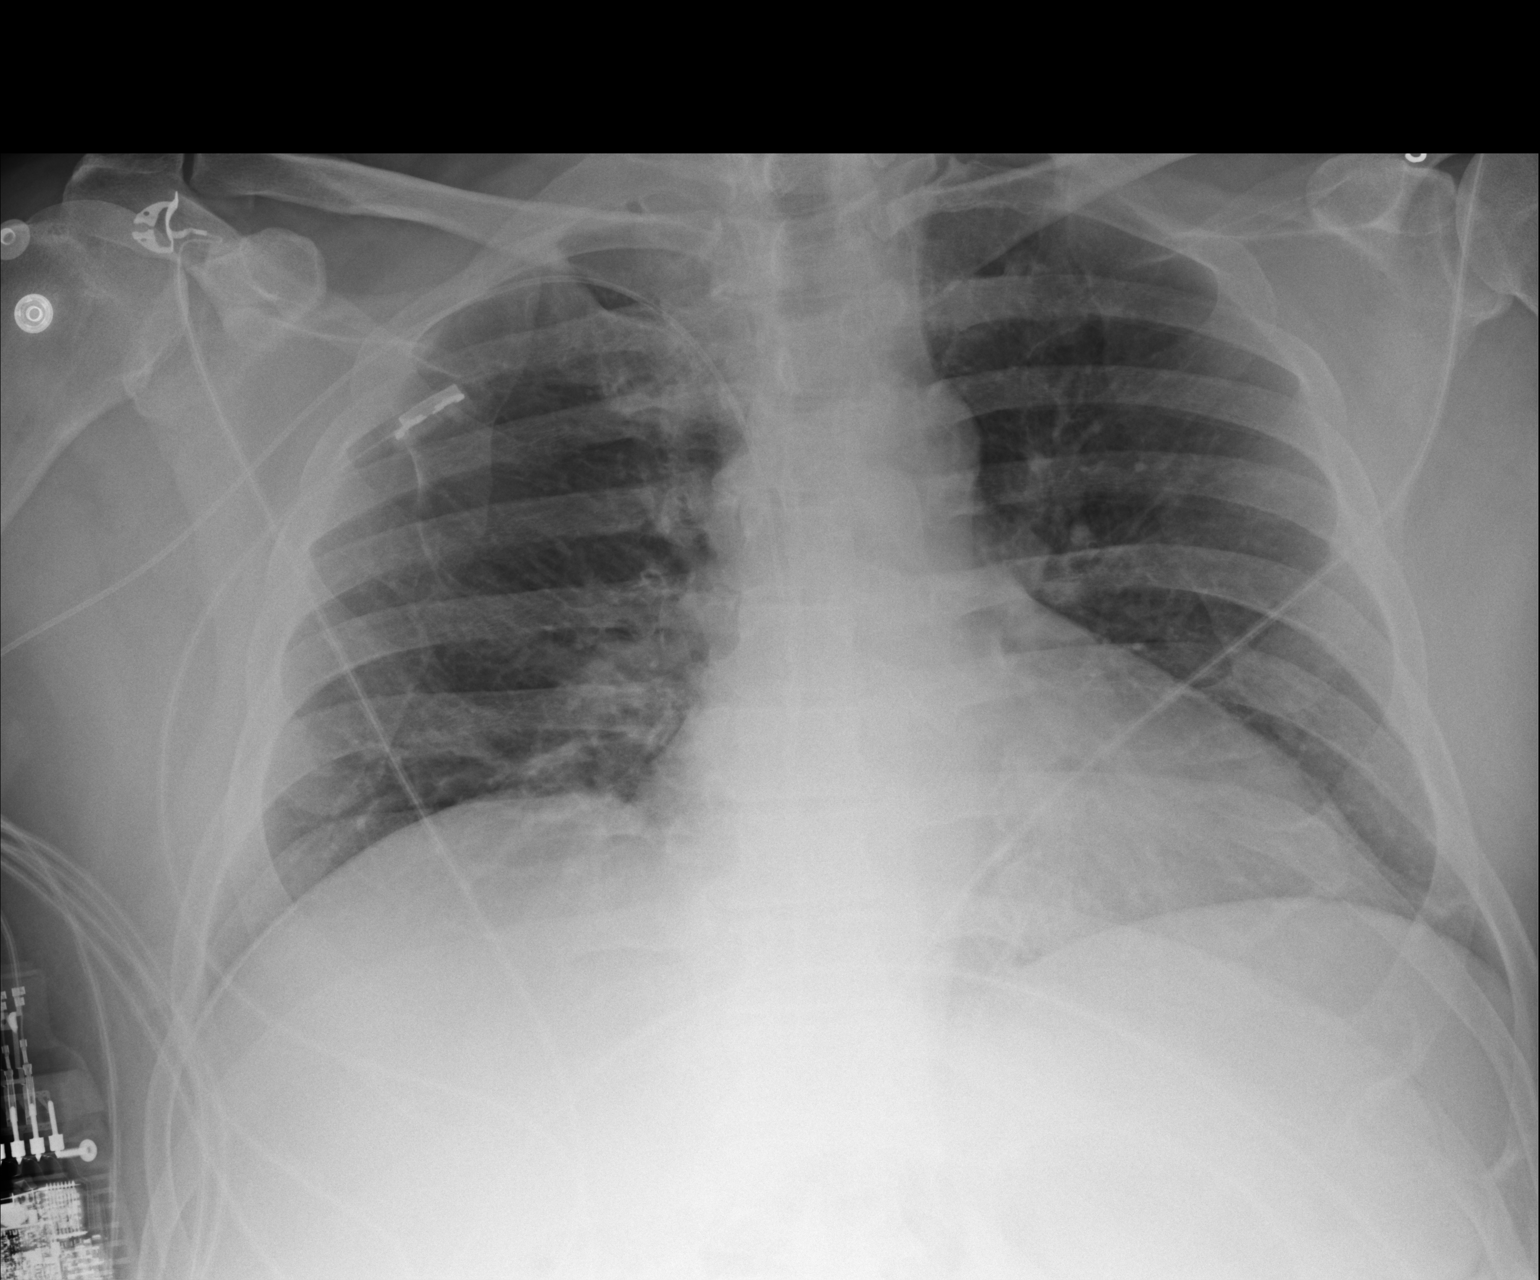

[1 of 1 positions shown; findings below may reference images not displayed]

FINDINGS: There has been interval placement of a right PICC catheter with tip
overlying the distal SVC.

Cardiac and mediastinal silhouettes are unchanged.

Lungs are mildly hypoinflated with mild bibasilar atelectasis. No
focal infiltrate, pulmonary edema, or pleural effusion. No
pneumothorax.

Osseous structures are unchanged.
IMPRESSION: Tip of right PICC catheter overlying the distal SVC.

Shallow lung inflation with mild bibasilar atelectasis.

## 2015-02-27 ENCOUNTER — Other Ambulatory Visit: Payer: Self-pay | Admitting: Cardiovascular Disease

## 2015-02-27 NOTE — Telephone Encounter (Signed)
REFILL 

## 2015-03-13 ENCOUNTER — Telehealth: Payer: Self-pay | Admitting: Cardiovascular Disease

## 2015-03-13 ENCOUNTER — Other Ambulatory Visit: Payer: Self-pay

## 2015-03-13 MED ORDER — NITROGLYCERIN 0.4 MG SL SUBL: 0.4000 mg | SUBLINGUAL_TABLET | SUBLINGUAL | Status: DC | PRN

## 2015-03-13 MED ORDER — NITROGLYCERIN 0.4 MG SL SUBL: SUBLINGUAL_TABLET | SUBLINGUAL | Status: DC

## 2015-03-13 NOTE — Telephone Encounter (Signed)
Patient called in and needed refill of nitroglycerin.  Has an Appointment with Dr. Tresa Endo August 4th  Patient requested 4 bottle multi pack because her carries them around in his pocket and they disintegrates easily.

## 2015-03-13 NOTE — Telephone Encounter (Signed)
°  1. Which medications need to be refilled?Nitrogyclerin   2. Which pharmacy is medication to be sent to?[facility] [place] on [address]  3. Do they need a 30 day or 90 day supply? 30  4. Would they like a call back once the medication has been sent to the pharmacy? Yes

## 2015-03-19 ENCOUNTER — Ambulatory Visit (INDEPENDENT_AMBULATORY_CARE_PROVIDER_SITE_OTHER): Payer: Commercial Managed Care - HMO | Admitting: Cardiovascular Disease

## 2015-03-19 ENCOUNTER — Encounter: Payer: Self-pay | Admitting: Cardiovascular Disease

## 2015-03-19 VITALS — BP 160/94 | HR 57 | Ht 70.5 in | Wt 238.8 lb

## 2015-03-19 DIAGNOSIS — E785 Hyperlipidemia, unspecified: Secondary | ICD-10-CM

## 2015-03-19 DIAGNOSIS — Z79899 Other long term (current) drug therapy: Secondary | ICD-10-CM

## 2015-03-19 DIAGNOSIS — I25810 Atherosclerosis of coronary artery bypass graft(s) without angina pectoris: Secondary | ICD-10-CM | POA: Diagnosis not present

## 2015-03-19 DIAGNOSIS — F32A Depression, unspecified: Secondary | ICD-10-CM

## 2015-03-19 DIAGNOSIS — G4733 Obstructive sleep apnea (adult) (pediatric): Secondary | ICD-10-CM

## 2015-03-19 DIAGNOSIS — F329 Major depressive disorder, single episode, unspecified: Secondary | ICD-10-CM | POA: Diagnosis not present

## 2015-03-19 DIAGNOSIS — I10 Essential (primary) hypertension: Secondary | ICD-10-CM

## 2015-03-19 DIAGNOSIS — G629 Polyneuropathy, unspecified: Secondary | ICD-10-CM

## 2015-03-19 MED ORDER — AMLODIPINE BESYLATE 5 MG PO TABS: 5.0000 mg | ORAL_TABLET | Freq: Every day | ORAL | Status: AC

## 2015-03-19 NOTE — Patient Instructions (Signed)
Your physician has recommended you make the following change in your medication: start new prescription for amlodipine 5 mg. This has already been sent nto your pharmacy.   Your physician recommends that you return for lab work fasting.  Your physician recommends that you schedule a follow-up appointment in: 3 months with Dr. Tresa Endo.

## 2015-03-22 ENCOUNTER — Encounter: Payer: Self-pay | Admitting: Cardiovascular Disease

## 2015-03-22 DIAGNOSIS — E785 Hyperlipidemia, unspecified: Secondary | ICD-10-CM | POA: Insufficient documentation

## 2015-03-22 DIAGNOSIS — I10 Essential (primary) hypertension: Secondary | ICD-10-CM | POA: Insufficient documentation

## 2015-03-22 NOTE — Progress Notes (Signed)
Patient ID: Leonard Murray, male   DOB: July 14, 1961, 54 y.o.   MRN: [id number]    HPI: Leonard Murray is a 54 y.o. male who presents to the office today for a 21 month followup cardiologicevaluation.   Leonard Murray has established CAD and in May 2009 underwent stenting of his mid and distal RCA. His last catheterization was in August 2010 which revealed patent stents. He had mild 20% stenosis in the LAD and mild 10-20% proximal and distal RCA stenoses. He also had an area PLA spasm which did improve with IC nitroglycerin administration. The patient has a history of ongoing tobacco use; he started smoking at age 54.  Previously he had smoked up to 3 packs per day but now is smoking less than one pack per day.Marland Kitchen He  has a history of significant peripheral neuropathy. He also has a history of hypertension, anxiety, mixed hyperlipidemia, and chronic pain syndrome.  He has chronic pain involving his feet back neck shoulders and was undergoing pain management and when I last saw him was on OxyContin and oxycodone. He also is on Paxil and Lyrica. Blood pressure perspective he most recently has been taking Lasix 40 mg as needed lisinopril 20 mg amlodipine 5 mg.  Recently, he completely discontinued the OxyContin 20 days ago and also quit taking Xanax.  He still takes oxycodone, Paxil and Lyrica.  He also has a history of GERD for which he takes Protonix.  He continues to be on dual antiplatelets therapy.  He takes Elavil at bedtime.  He denies recent chest pain.  He denies PND, orthopnea.  He has obstructive sleep apnea which is untreated.  Remotely he had worn CPAP.  He's not using this, but states that now that he is off some of his medications.  He is sleeping much better.  He has not had recent laboratory.  Past Medical History  Diagnosis Date  . CAD (coronary artery disease) 12/2007    AMI with stenting; retinal embolus 08/2005; stenting 03/2009  . Hyperlipidemia   . OSA (obstructive sleep apnea)     not  using CPAP-feels suffocating  . GAD (generalized anxiety disorder)   . Depression   . Chronic back pain     injuries 1998, 2008, 01/2009  . Peripheral neuropathy   . Hypogonadism male     can't afford testosterone  . Anginal pain   . Myocardial infarction   . Stroke     lost vision at time of stroke - but regained vision - no deficits  . GERD (gastroesophageal reflux disease)   . Arthritis     knees and back  . Osteomyelitis     right second and third toes  . Hypertension     PT STATES HE HAS HAD PROBLEMS WITH LOW BLOOD PRESSURE AT TIMES AND HAS FALLEN--MOST RECENT HYPOTENSION WAS 09/20/13 AND WAS ADMITTED TO [facility] WITH OSTEOMYELITIS.  Marland Kitchen Acute renal failure 09/18/2013    RESOLVED BY DISCHARGE FROM [facility] ON 09/20/2013  . Hyperlipidemia   . Wound cellulitis 12-24-13    right 2nd toe  . Impaired vision     hx. loss of some visual field -past- no residual  . Anxiety     "drools more when stressed"    Past Surgical History  Procedure Laterality Date  . Bony pelvis surgery    . Bladder repair    . Fracture surgery    . Cardiac catheterization    . Coronary angioplasty    . Neck  surgery      fusion--some limitations in neck movement  . Amputation Right 09/27/2013    Procedure: AMPUTATION RIGHT 3RD TOE ;  Surgeon: Mcarthur Rossetti, MD;  Location: [facility];  Service: Orthopedics;  Laterality: Right;  . Coronary angioplasty with stent placement      x2  . Amputation Right 12/26/2013    Procedure: RIGHT 2nd TOE AMPUTATION;  Surgeon: Mcarthur Rossetti, MD;  Location: [facility];  Service: Orthopedics;  Laterality: Right;    No Known Allergies  Current Outpatient Prescriptions  Medication Sig Dispense Refill  . amitriptyline (ELAVIL) 100 MG tablet Take 1 tablet (100 mg total) by mouth at bedtime. 30 tablet 3  . aspirin EC 81 MG tablet Take 81 mg by mouth daily.    Marland Kitchen atorvastatin (LIPITOR) 40 MG tablet Take 1 tablet (40 mg total) by mouth daily. KEEP OV. 30 tablet 0  .  clopidogrel (PLAVIX) 75 MG tablet Take 1 tablet (75 mg total) by mouth daily. <MUST KEEP APPOINTMENT 03/19/15> 30 tablet 2  . metoprolol succinate (TOPROL XL) 25 MG 24 hr tablet Take 1 tablet (25 mg total) by mouth daily. 30 tablet 11  . nitroGLYCERIN (NITROSTAT) 0.4 MG SL tablet Dispense in multi bottle dose pack of 4 bottles if available 100 tablet 3  . oxyCODONE (ROXICODONE) 15 MG immediate release tablet Take 15 mg by mouth every 4 (four) hours.    . pantoprazole (PROTONIX) 40 MG tablet Take 1 tablet (40 mg total) by mouth daily. 30 tablet 3  . PARoxetine (PAXIL) 20 MG tablet Take 1 tablet (20 mg total) by mouth every morning. 30 tablet 3  . pregabalin (LYRICA) 100 MG capsule Take 150 mg by mouth 3 (three) times daily.     . tamsulosin (FLOMAX) 0.4 MG CAPS capsule Take 0.4 mg by mouth at bedtime.    Marland Kitchen amLODipine (NORVASC) 5 MG tablet Take 1 tablet (5 mg total) by mouth daily. 30 tablet 6   No current facility-administered medications for this visit.    Socially, he is divorced. He has one child. He still smoking one half pack per day of cigarettes. He does admit to chronic pain. He does drink occasional beer.  ROS General: Negative; No fevers, chills, or night sweats;  HEENT: Negative; No changes in vision or hearing, sinus congestion, difficulty swallowing Pulmonary: Negative; No cough, wheezing, shortness of breath, hemoptysis Cardiovascular: Negative; No chest pain, presyncope, syncope, palpitations GI: Positive for GERD; No nausea, vomiting, diarrhea, or abdominal pain GU: Positive for mild erectile dysfunction Musculoskeletal: Negative; no myalgias, joint pain, or weakness Hematologic/Oncology: Negative; no easy bruising, bleeding Endocrine: Negative; no heat/cold intolerance; no diabetes Neuro: Positive for peripheral neuropathy Skin: Negative; No rashes or skin lesions Psychiatric: Positive for depression; No behavioral problems,  Sleep: Positive for untreated sleep apnea.  He  denies hypersomnolence., bruxism, restless legs, hypnogognic hallucinations, no cataplexy Other comprehensive 14 point system review is negative.  PE BP 160/94 mmHg  Pulse 57  Ht 5' 10.5" (1.791 m)  Wt 238 lb 12.8 oz (108.319 kg)  BMI 33.77 kg/m2  Repeat blood pressure by me 160/90  Wt Readings from Last 3 Encounters:  03/19/15 238 lb 12.8 oz (108.319 kg)  12/22/14 239 lb 6.4 oz (108.591 kg)  12/24/13 239 lb 8 oz (108.636 kg)   General: Alert, oriented, no distress.  HEENT: Normocephalic, atraumatic. Pupils round and reactive; sclera anicteric;  Nares without nasal septal hypertrophy Mouth/Parynx benign; Mallinpatti scale 2/3 Neck: Thick neck; No JVD, no  carotid bruits with normal carotid upstroke Lungs: decreased BS without wheezing Chest wall: Nontender to palpation Heart: RRR, s1 s2 normal 1/6 SEM; no diastolic murmur.  No rubs thrills or heaves Abdomen: soft, nontender; no hepatosplenomehaly, BS+; abdominal aorta nontender and not dilated by palpation Back: No CVA tenderness Pulses 2+ Extremities: no clubbing cyanosis or edema, Homan's sign negative  Neurologic: grossly nonfocal Psychological: Normal affect  ECG (independently read by me): Sinus bradycardia 57 bpm.  Normal intervals.  No significant ST changes.  November 2014 ECG: sinus rhythm at 84 beats per minute. Left axis deviation. PR interval 178 milliseconds; QTc interval 432 milliseconds  LABS: BMP Latest Ref Rng 12/24/2013 09/25/2013 09/20/2013  Glucose 70 - 99 mg/dL 133(H) 99 101(H)  BUN 6 - 23 mg/dL $Remove'10 14 12  'mhVmgND$ Creatinine 0.50 - 1.35 mg/dL 0.79 0.84 0.92  Sodium 137 - 147 mEq/L 137 139 140  Potassium 3.7 - 5.3 mEq/L 4.4 5.0 4.4  Chloride 96 - 112 mEq/L 99 102 105  CO2 19 - 32 mEq/L $Remove'29 29 23  'ejoFjqI$ Calcium 8.4 - 10.5 mg/dL 9.5 9.1 8.7   Hepatic Function Latest Ref Rng 09/19/2013 09/18/2013 07/18/2013  Total Protein 6.0 - 8.3 g/dL 6.3 7.7 6.8  Albumin 3.5 - 5.2 g/dL 3.0(L) 3.8 4.3  AST 0 - 37 U/L $Remo'17 18 24  'GQMNa$ ALT 0 - 53  U/L $Re'17 19 31  'haO$ Alk Phosphatase 39 - 117 U/L 81 92 72  Total Bilirubin 0.3 - 1.2 mg/dL 0.2(L) 0.5 0.4  Bilirubin, Direct 0.0 - 0.3 mg/dL - - 0.1   CBC Latest Ref Rng 12/24/2013 09/25/2013 09/19/2013  WBC 4.0 - 10.5 K/uL 7.3 6.7 8.1  Hemoglobin 13.0 - 17.0 g/dL 15.5 14.0 12.4(L)  Hematocrit 39.0 - 52.0 % 44.8 40.1 35.4(L)  Platelets 150 - 400 K/uL 199 199 163   Lab Results  Component Value Date   MCV 90.0 12/24/2013   MCV 88.5 09/25/2013   MCV 88.1 09/19/2013   Lab Results  Component Value Date   TSH 1.603 09/18/2013   Lab Results  Component Value Date   HGBA1C 5.6 09/18/2013   Lipid Panel     Component Value Date/Time   CHOL 168 09/25/2012 1611   TRIG 105 09/25/2012 1611   HDL 47 09/25/2012 1611   CHOLHDL 3.6 09/25/2012 1611   VLDL 21 09/25/2012 1611   LDLCALC 100* 09/25/2012 1611   RADIOLOGY: No results found.    ASSESSMENT AND PLAN:  Leonard Murray is a 54 year old gentleman with established CAD and is status post prior stenting to his mid and distal RCA in 2009.  He has a history of hypertension.  His blood pressure today is elevated.  I am adding amlodipine 5 mg to his medical regimen and he will continue to take Toprol-XL 25 mg daily.  He feels significantly improved with reduction of some of his pain medication and he now is off OxyContin as well as Xanax but continues to take oxycodone for pain management.  He also has peripheral neuropathy treated with Lyrica.  He has a history of depression on Paxil in addition to amitriptyline.  He continues to be on dual antiplatelets therapy and denies bleeding.  He has a history of hyperlipidemia and appears to be tolerating atorvastatin 40 mg daily.  Target LDL is less than 70.  He has not had recent lab work and laboratory will be obtained in the fasting state.  He has obstructive sleep apnea, currently untreated, but feels that he is sleeping well  and denies residual daytime sleepiness.  He admits to restorative sleep.  We  discussed the importance of smoking cessation.  We also discussed weight loss with his mild obesity and body mass index of 33.8 kg/m. I will see him in 3 months for reevaluation or sooner if problems arise.  Troy Sine, MD, [facility]  03/22/2015 2:38 PM

## 2015-03-29 ENCOUNTER — Other Ambulatory Visit: Payer: Self-pay | Admitting: Cardiovascular Disease

## 2015-03-30 NOTE — Telephone Encounter (Signed)
Spoke to patient, he already received prescription. Will close.

## 2015-03-30 NOTE — Telephone Encounter (Signed)
Rx(s) sent to pharmacy electronically.  

## 2015-04-27 ENCOUNTER — Other Ambulatory Visit: Payer: Self-pay | Admitting: Internal Medicine

## 2015-05-12 NOTE — Telephone Encounter (Signed)
Patient requested a med refill for Protonix. Please f/u with pt.

## 2015-05-14 ENCOUNTER — Other Ambulatory Visit: Payer: Self-pay | Admitting: *Deleted

## 2015-05-14 DIAGNOSIS — F329 Major depressive disorder, single episode, unspecified: Secondary | ICD-10-CM

## 2015-05-14 DIAGNOSIS — F32A Depression, unspecified: Secondary | ICD-10-CM

## 2015-05-14 DIAGNOSIS — K219 Gastro-esophageal reflux disease without esophagitis: Secondary | ICD-10-CM

## 2015-05-14 MED ORDER — PAROXETINE HCL 20 MG PO TABS: 20.0000 mg | ORAL_TABLET | Freq: Every morning | ORAL | Status: DC

## 2015-05-14 MED ORDER — PANTOPRAZOLE SODIUM 40 MG PO TBEC: 40.0000 mg | DELAYED_RELEASE_TABLET | Freq: Every day | ORAL | Status: DC

## 2015-05-24 ENCOUNTER — Other Ambulatory Visit: Payer: Self-pay | Admitting: Cardiovascular Disease

## 2015-05-25 NOTE — Telephone Encounter (Signed)
REFILL 

## 2015-06-09 ENCOUNTER — Other Ambulatory Visit: Payer: Self-pay | Admitting: Cardiovascular Disease

## 2015-06-09 LAB — CBC
HEMATOCRIT: 49.4 % (ref 39.0–52.0)
HEMOGLOBIN: 18 g/dL — AB (ref 13.0–17.0)
MCH: 34.2 pg — AB (ref 26.0–34.0)
MCHC: 36.4 g/dL — ABNORMAL HIGH (ref 30.0–36.0)
MCV: 93.7 fL (ref 78.0–100.0)
MPV: 10.2 fL (ref 8.6–12.4)
Platelets: 216 10*3/uL (ref 150–400)
RBC: 5.27 MIL/uL (ref 4.22–5.81)
RDW: 14.7 % (ref 11.5–15.5)
WBC: 9 10*3/uL (ref 4.0–10.5)

## 2015-06-09 LAB — COMPREHENSIVE METABOLIC PANEL
ALBUMIN: 4.4 g/dL (ref 3.6–5.1)
ALK PHOS: 75 U/L (ref 40–115)
ALT: 26 U/L (ref 9–46)
AST: 22 U/L (ref 10–35)
BUN: 14 mg/dL (ref 7–25)
CO2: 23 mmol/L (ref 20–31)
CREATININE: 0.74 mg/dL (ref 0.70–1.33)
Calcium: 9.4 mg/dL (ref 8.6–10.3)
Chloride: 102 mmol/L (ref 98–110)
Glucose, Bld: 115 mg/dL — ABNORMAL HIGH (ref 65–99)
POTASSIUM: 4.2 mmol/L (ref 3.5–5.3)
Sodium: 136 mmol/L (ref 135–146)
Total Bilirubin: 1.6 mg/dL — ABNORMAL HIGH (ref 0.2–1.2)
Total Protein: 7.1 g/dL (ref 6.1–8.1)

## 2015-06-09 LAB — LIPID PANEL
Cholesterol: 157 mg/dL (ref 125–200)
HDL: 49 mg/dL (ref >=40)
LDL CALC: 91 mg/dL (ref <130)
Total CHOL/HDL Ratio: 3.2 Ratio (ref <=5.0)
Triglycerides: 83 mg/dL (ref <150)
VLDL: 17 mg/dL (ref <30)

## 2015-06-09 LAB — TSH: TSH: 0.701 u[IU]/mL (ref 0.350–4.500)

## 2015-06-18 ENCOUNTER — Telehealth: Payer: Self-pay | Admitting: Cardiovascular Disease

## 2015-06-18 NOTE — Telephone Encounter (Signed)
Spoke with Leonard Murray, he has noticed swelling in his feet and ankles for the last 4 to 5 days. The left > right, worse by the end of the day. He does not weigh. A little SOB occ with exertion through the home or when taking for a long time. His girlfriend says he has 3+ pitting edema. He is no longer taking amlodipine because his bp was running low and he was having dizziness. He is not taking flomax as well. [facility]

## 2015-06-18 NOTE — Telephone Encounter (Signed)
Patient has not heard from his lab work.  Patient also states he has had swelling in his legs for the past 1 1/2 weeks.

## 2015-06-19 NOTE — Telephone Encounter (Signed)
Leonard Murray is calling because he still has swelling in his feet and legs.Marland Kitchen.Marland Kitchen.Please call    Thanks

## 2015-06-23 MED ORDER — FUROSEMIDE 20 MG PO TABS: ORAL_TABLET | ORAL | Status: DC

## 2015-06-23 NOTE — Telephone Encounter (Signed)
Try lasix 40 mg po for 3 days then PRN; labs reviewed; renal fxn stable

## 2015-06-23 NOTE — Telephone Encounter (Signed)
Spoke with pt, [facility]. Patient voiced understanding of how to take furosemide. Script sent to the pharmacy

## 2015-06-23 NOTE — Telephone Encounter (Signed)
Left message for pt to call.

## 2015-07-02 ENCOUNTER — Encounter: Payer: Self-pay | Admitting: Cardiovascular Disease

## 2015-07-13 ENCOUNTER — Telehealth: Payer: Self-pay | Admitting: Internal Medicine

## 2015-07-13 NOTE — Telephone Encounter (Signed)
Patient called and requested a med refill for Lyrica, patient stated that that medication is usually prescribed by pain management but his doctor is on vacation and he is also in the process of finding a different facility. Please f/u °

## 2015-07-13 NOTE — Telephone Encounter (Signed)
Patient called and requested a med refill for Lyrica, patient stated that that medication is usually prescribed by pain management but his doctor is on vacation and he is also in the process of finding a different facility. Please f/u

## 2015-07-14 ENCOUNTER — Telehealth: Payer: Self-pay

## 2015-07-14 NOTE — Telephone Encounter (Signed)
Patient called requesting a refill on his lyrica Patient states he usually get the medication from pain management but  His doctor is on vacation Is this something we can refill

## 2015-07-15 MED ORDER — PREGABALIN 150 MG PO CAPS
150.0000 mg | ORAL_CAPSULE | Freq: Three times a day (TID) | ORAL | Status: AC
Start: 2015-07-15 — End: ?

## 2015-07-15 NOTE — Telephone Encounter (Signed)
lyrica refilled and ready for pick up Please inform patient

## 2015-07-20 ENCOUNTER — Telehealth: Payer: Self-pay

## 2015-07-20 NOTE — Telephone Encounter (Signed)
Attempted to contact patient to let him know his RX is ready for pick Up.  Call went right to voice mail  Message left to return our call

## 2015-07-29 ENCOUNTER — Telehealth: Payer: Self-pay | Admitting: Internal Medicine

## 2015-07-29 NOTE — Telephone Encounter (Signed)
Xanax not on active med list OV needed to discuss Also patient advised to seek mental health services at the [facility] and [facility].

## 2015-07-29 NOTE — Telephone Encounter (Signed)
Patient came into facility to pick up prescription for Lyrica prescribed on 07/15/2015.  Patient also requested a med refill for ALPRAZolam Prudy Feeler(XANAX) tablet 1 mg. Please f/u with pt.

## 2015-07-29 NOTE — Telephone Encounter (Signed)
LVM to return call    Pt need OV last visit May 2016 Rx Xanax not in active medication list

## 2015-08-07 ENCOUNTER — Ambulatory Visit: Payer: Commercial Managed Care - HMO | Admitting: Cardiology

## 2015-08-07 ENCOUNTER — Ambulatory Visit: Payer: Commercial Managed Care - HMO | Admitting: Cardiovascular Disease

## 2015-08-19 ENCOUNTER — Ambulatory Visit (INDEPENDENT_AMBULATORY_CARE_PROVIDER_SITE_OTHER): Payer: Commercial Managed Care - HMO | Admitting: Cardiology

## 2015-08-19 ENCOUNTER — Encounter: Payer: Self-pay | Admitting: Cardiology

## 2015-08-19 ENCOUNTER — Other Ambulatory Visit: Payer: Self-pay | Admitting: *Deleted

## 2015-08-19 VITALS — BP 146/80 | HR 80 | Ht 70.5 in | Wt 247.0 lb

## 2015-08-19 DIAGNOSIS — G4733 Obstructive sleep apnea (adult) (pediatric): Secondary | ICD-10-CM

## 2015-08-19 DIAGNOSIS — Z9861 Coronary angioplasty status: Secondary | ICD-10-CM

## 2015-08-19 DIAGNOSIS — F411 Generalized anxiety disorder: Secondary | ICD-10-CM

## 2015-08-19 DIAGNOSIS — Z720 Tobacco use: Secondary | ICD-10-CM

## 2015-08-19 DIAGNOSIS — I2510 Atherosclerotic heart disease of native coronary artery without angina pectoris: Secondary | ICD-10-CM | POA: Diagnosis not present

## 2015-08-19 DIAGNOSIS — M549 Dorsalgia, unspecified: Secondary | ICD-10-CM

## 2015-08-19 DIAGNOSIS — M869 Osteomyelitis, unspecified: Secondary | ICD-10-CM | POA: Diagnosis not present

## 2015-08-19 DIAGNOSIS — I10 Essential (primary) hypertension: Secondary | ICD-10-CM

## 2015-08-19 DIAGNOSIS — F1010 Alcohol abuse, uncomplicated: Secondary | ICD-10-CM

## 2015-08-19 DIAGNOSIS — E785 Hyperlipidemia, unspecified: Secondary | ICD-10-CM

## 2015-08-19 DIAGNOSIS — F17200 Nicotine dependence, unspecified, uncomplicated: Secondary | ICD-10-CM

## 2015-08-19 DIAGNOSIS — F1011 Alcohol abuse, in remission: Secondary | ICD-10-CM

## 2015-08-19 DIAGNOSIS — G588 Other specified mononeuropathies: Secondary | ICD-10-CM

## 2015-08-19 DIAGNOSIS — G8929 Other chronic pain: Secondary | ICD-10-CM

## 2015-08-19 NOTE — Assessment & Plan Note (Signed)
Chronic numbness in LE

## 2015-08-19 NOTE — Assessment & Plan Note (Signed)
He is taking Xanax 1 mg that he 'got from a friend"

## 2015-08-19 NOTE — Assessment & Plan Note (Signed)
On Lipitor 40 mg. He is complaining of leg pain and cramping. Will try a statin holiday x 2 weeks.

## 2015-08-19 NOTE — Patient Instructions (Signed)
Stay off Lipitor for 2 weeks.If leg no better ok to start back.If leg is better call and let Dr.Kelly know.  Your physician wants you to follow-up in: 6 months with Dr.Kelly. You will receive a reminder letter in the mail two months in advance. If you don't receive a letter, please call our office to schedule the follow-up appointment.

## 2015-08-19 NOTE — Assessment & Plan Note (Signed)
Norvasc added Aug 2016
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
## 2015-08-19 NOTE — Assessment & Plan Note (Signed)
C-pap intolerant 
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
## 2015-08-19 NOTE — Assessment & Plan Note (Signed)
RCA PCI May 2009, patent with mild residulal CAD Aug 2010 NL LVF by echo 2013 It does not sound like he is having angina

## 2015-08-19 NOTE — Assessment & Plan Note (Signed)
In Nov 2016 he was fired from his pain clinic [place] [facility] his [facility].

## 2015-08-19 NOTE — Assessment & Plan Note (Signed)
S/P Rt 2nd and 3d toe amputation for osteomyelitis May 2015

## 2015-08-20 NOTE — Telephone Encounter (Signed)
LVM to return call.

## 2015-08-20 NOTE — Telephone Encounter (Signed)
Pt advised to seek mental health services at  [facility]  Pt Verbalized understanding  Information mail to pt

## 2015-08-21 NOTE — Telephone Encounter (Signed)
Agreed  Patient also need f/u OV

## 2015-09-11 ENCOUNTER — Other Ambulatory Visit: Payer: Self-pay | Admitting: Cardiovascular Disease

## 2015-09-11 NOTE — Telephone Encounter (Signed)
Rx request sent to pharmacy.  

## 2015-09-15 ENCOUNTER — Other Ambulatory Visit: Payer: Self-pay | Admitting: Cardiovascular Disease

## 2015-09-15 NOTE — Telephone Encounter (Signed)
Rx request sent to pharmacy.  

## 2015-10-09 ENCOUNTER — Other Ambulatory Visit: Payer: Self-pay | Admitting: *Deleted

## 2015-10-09 DIAGNOSIS — K219 Gastro-esophageal reflux disease without esophagitis: Secondary | ICD-10-CM

## 2015-10-09 MED ORDER — PANTOPRAZOLE SODIUM 40 MG PO TBEC: 40.0000 mg | DELAYED_RELEASE_TABLET | Freq: Every day | ORAL | Status: AC

## 2015-10-14 ENCOUNTER — Other Ambulatory Visit: Payer: Self-pay | Admitting: Cardiovascular Disease

## 2015-10-15 NOTE — Telephone Encounter (Signed)
REFILL 

## 2015-10-16 ENCOUNTER — Other Ambulatory Visit: Payer: Self-pay | Admitting: Family Medicine

## 2015-10-16 DIAGNOSIS — F32A Depression, unspecified: Secondary | ICD-10-CM

## 2015-10-16 DIAGNOSIS — F329 Major depressive disorder, single episode, unspecified: Secondary | ICD-10-CM

## 2015-10-16 MED ORDER — PAROXETINE HCL 20 MG PO TABS: 20.0000 mg | ORAL_TABLET | Freq: Every morning | ORAL | Status: AC

## 2016-01-31 ENCOUNTER — Other Ambulatory Visit: Payer: Self-pay | Admitting: Cardiovascular Disease

## 2016-02-01 NOTE — Telephone Encounter (Signed)
Rx(s) sent to pharmacy electronically.  

## 2016-09-13 ENCOUNTER — Other Ambulatory Visit: Payer: Self-pay | Admitting: Cardiovascular Disease

## 2016-09-13 NOTE — Telephone Encounter (Signed)
Rx(s) sent to pharmacy electronically.  

## 2016-11-05 ENCOUNTER — Other Ambulatory Visit: Payer: Self-pay | Admitting: Cardiovascular Disease

## 2016-11-07 NOTE — Telephone Encounter (Signed)
Rx(s) sent to pharmacy electronically.  

## 2018-03-20 ENCOUNTER — Other Ambulatory Visit: Payer: Self-pay | Admitting: Cardiovascular Disease

## 2018-03-20 NOTE — Telephone Encounter (Signed)
Rx sent to pharmacy   

## 2018-04-22 ENCOUNTER — Other Ambulatory Visit: Payer: Self-pay | Admitting: Cardiovascular Disease

## 2018-04-23 NOTE — Telephone Encounter (Signed)
Rx request sent to pharmacy.  

## 2022-10-14 DEATH — deceased
